# Patient Record
Sex: Female | Born: 1970
Health system: Southern US, Community
[De-identification: ages and names within clinical notes are randomized; demographics above are authoritative.]

## PROBLEM LIST (undated history)

## (undated) DIAGNOSIS — J45909 Unspecified asthma, uncomplicated: Secondary | ICD-10-CM

## (undated) DIAGNOSIS — T7840XA Allergy, unspecified, initial encounter: Secondary | ICD-10-CM

## (undated) DIAGNOSIS — E785 Hyperlipidemia, unspecified: Secondary | ICD-10-CM

## (undated) DIAGNOSIS — IMO0001 Reserved for inherently not codable concepts without codable children: Secondary | ICD-10-CM

## (undated) DIAGNOSIS — E119 Type 2 diabetes mellitus without complications: Secondary | ICD-10-CM

## (undated) HISTORY — DX: Reserved for inherently not codable concepts without codable children: IMO0001

## (undated) HISTORY — PX: FOOT SURGERY: SHX648

## (undated) HISTORY — PX: KNEE SURGERY: SHX244

## (undated) HISTORY — PX: TUBAL LIGATION: SHX77

## (undated) HISTORY — DX: Unspecified asthma, uncomplicated: J45.909

## (undated) HISTORY — DX: Allergy, unspecified, initial encounter: T78.40XA

## (undated) HISTORY — DX: Hyperlipidemia, unspecified: E78.5

## (undated) HISTORY — PX: ABDOMINAL HYSTERECTOMY: SHX81

---

## 2012-11-27 DIAGNOSIS — IMO0002 Reserved for concepts with insufficient information to code with codable children: Secondary | ICD-10-CM

## 2012-11-27 DIAGNOSIS — M1710 Unilateral primary osteoarthritis, unspecified knee: Secondary | ICD-10-CM | POA: Insufficient documentation

## 2012-11-27 HISTORY — DX: Reserved for concepts with insufficient information to code with codable children: IMO0002

## 2014-03-27 DIAGNOSIS — J4520 Mild intermittent asthma, uncomplicated: Secondary | ICD-10-CM | POA: Insufficient documentation

## 2014-03-27 DIAGNOSIS — E785 Hyperlipidemia, unspecified: Secondary | ICD-10-CM

## 2014-03-27 DIAGNOSIS — E559 Vitamin D deficiency, unspecified: Secondary | ICD-10-CM | POA: Insufficient documentation

## 2014-03-27 HISTORY — DX: Hyperlipidemia, unspecified: E78.5

## 2014-09-15 DIAGNOSIS — M999 Biomechanical lesion, unspecified: Secondary | ICD-10-CM | POA: Insufficient documentation

## 2014-09-15 DIAGNOSIS — M9901 Segmental and somatic dysfunction of cervical region: Secondary | ICD-10-CM | POA: Insufficient documentation

## 2014-09-15 DIAGNOSIS — IMO0001 Reserved for inherently not codable concepts without codable children: Secondary | ICD-10-CM | POA: Insufficient documentation

## 2015-10-31 DIAGNOSIS — M2390 Unspecified internal derangement of unspecified knee: Secondary | ICD-10-CM | POA: Insufficient documentation

## 2017-03-30 DIAGNOSIS — K5909 Other constipation: Secondary | ICD-10-CM | POA: Insufficient documentation

## 2017-03-30 DIAGNOSIS — K219 Gastro-esophageal reflux disease without esophagitis: Secondary | ICD-10-CM | POA: Insufficient documentation

## 2017-03-30 DIAGNOSIS — J301 Allergic rhinitis due to pollen: Secondary | ICD-10-CM | POA: Insufficient documentation

## 2017-05-17 DIAGNOSIS — R7301 Impaired fasting glucose: Secondary | ICD-10-CM | POA: Insufficient documentation

## 2017-05-17 HISTORY — DX: Impaired fasting glucose: R73.01

## 2017-10-04 DIAGNOSIS — G4733 Obstructive sleep apnea (adult) (pediatric): Secondary | ICD-10-CM | POA: Insufficient documentation

## 2017-10-04 DIAGNOSIS — G4739 Other sleep apnea: Secondary | ICD-10-CM | POA: Insufficient documentation

## 2019-07-03 DIAGNOSIS — B0229 Other postherpetic nervous system involvement: Secondary | ICD-10-CM | POA: Insufficient documentation

## 2020-01-22 DIAGNOSIS — R5382 Chronic fatigue, unspecified: Secondary | ICD-10-CM | POA: Diagnosis not present

## 2020-01-22 DIAGNOSIS — R7301 Impaired fasting glucose: Secondary | ICD-10-CM | POA: Diagnosis not present

## 2020-01-22 DIAGNOSIS — R635 Abnormal weight gain: Secondary | ICD-10-CM | POA: Diagnosis not present

## 2020-01-22 DIAGNOSIS — N958 Other specified menopausal and perimenopausal disorders: Secondary | ICD-10-CM | POA: Diagnosis not present

## 2020-01-22 DIAGNOSIS — E559 Vitamin D deficiency, unspecified: Secondary | ICD-10-CM | POA: Diagnosis not present

## 2020-02-04 DIAGNOSIS — Z1331 Encounter for screening for depression: Secondary | ICD-10-CM | POA: Diagnosis not present

## 2020-02-04 DIAGNOSIS — F5101 Primary insomnia: Secondary | ICD-10-CM | POA: Diagnosis not present

## 2020-02-04 DIAGNOSIS — R5382 Chronic fatigue, unspecified: Secondary | ICD-10-CM | POA: Diagnosis not present

## 2020-02-04 DIAGNOSIS — Z1339 Encounter for screening examination for other mental health and behavioral disorders: Secondary | ICD-10-CM | POA: Diagnosis not present

## 2020-02-04 DIAGNOSIS — N958 Other specified menopausal and perimenopausal disorders: Secondary | ICD-10-CM | POA: Diagnosis not present

## 2020-02-04 DIAGNOSIS — R232 Flushing: Secondary | ICD-10-CM | POA: Diagnosis not present

## 2020-02-19 DIAGNOSIS — Z6834 Body mass index (BMI) 34.0-34.9, adult: Secondary | ICD-10-CM | POA: Diagnosis not present

## 2020-02-19 DIAGNOSIS — E7800 Pure hypercholesterolemia, unspecified: Secondary | ICD-10-CM | POA: Diagnosis not present

## 2020-04-21 ENCOUNTER — Ambulatory Visit: Payer: Self-pay | Admitting: Family Medicine

## 2020-06-15 ENCOUNTER — Ambulatory Visit (INDEPENDENT_AMBULATORY_CARE_PROVIDER_SITE_OTHER): Payer: BC Managed Care – PPO

## 2020-06-15 ENCOUNTER — Ambulatory Visit: Payer: BC Managed Care – PPO | Admitting: Podiatry

## 2020-06-15 ENCOUNTER — Other Ambulatory Visit: Payer: Self-pay

## 2020-06-15 ENCOUNTER — Encounter: Payer: Self-pay | Admitting: Podiatry

## 2020-06-15 DIAGNOSIS — M2012 Hallux valgus (acquired), left foot: Secondary | ICD-10-CM

## 2020-06-15 DIAGNOSIS — M21611 Bunion of right foot: Secondary | ICD-10-CM

## 2020-06-15 DIAGNOSIS — M2042 Other hammer toe(s) (acquired), left foot: Secondary | ICD-10-CM | POA: Diagnosis not present

## 2020-06-15 DIAGNOSIS — M79675 Pain in left toe(s): Secondary | ICD-10-CM

## 2020-06-15 DIAGNOSIS — M2041 Other hammer toe(s) (acquired), right foot: Secondary | ICD-10-CM | POA: Diagnosis not present

## 2020-06-15 DIAGNOSIS — M79674 Pain in right toe(s): Secondary | ICD-10-CM

## 2020-06-15 DIAGNOSIS — M21612 Bunion of left foot: Secondary | ICD-10-CM

## 2020-06-15 DIAGNOSIS — M2011 Hallux valgus (acquired), right foot: Secondary | ICD-10-CM | POA: Diagnosis not present

## 2020-06-15 NOTE — Patient Instructions (Signed)
More silicone pads can be purchased from:  [email or web address]      Avoid shoes that crowd or compress the toes. Try the gel pads to see if they help. When you buy shoes next, have your feet measured for length and width.

## 2020-06-15 NOTE — Progress Notes (Signed)
  Subjective:  Patient ID: Cassidy Leach, female    DOB: 1971-01-04,  MRN: [id number]  Chief Complaint  Patient presents with  . Toe Pain    Pain in my toes when i wear closed toe shoes     49 y.o. female presents with the above complaint. History confirmed with patient.   Objective:  Physical Exam: warm, good capillary refill, no trophic changes or ulcerative lesions, normal DP and PT pulses and normal sensory exam.  Bilaterally she has mild hallux valgus with a dorsal medial eminence, adductovarus contracture toes of 4 and 5 and very mild semireducible hammertoes of 2 and 3, these are longer than the other toes  Radiographs: X-ray of both feet: Mild hallux abductovalgus with hammertoe contractures 2 through 5 with adductovarus rotation of 4 and 5 bilaterally Assessment:   1. Pain in toes of both feet   2. Hallux valgus with bunions, left   3. Hallux valgus with bunions, right   4. Hammertoe of right foot   5. Hammertoe of left foot      Plan:  Patient was evaluated and treated and all questions answered.  Discussed with her that her pain is likely secondary to mild hammertoe and bunion formation and that there is more pressure on the second and third toes.  This is exacerbated by the hallux valgus deformity as well as the adductovarus rotation of 4 and 5 and these are taking most of the pressure.  We discussed surgical and nonsurgical treatment options for hallux valgus and hammertoes.  I recommended today which she try shoe gear modifications which we discussed such as wider shoe gear and remove her toe box as well as make sure that they fit correctly from a lacing pattern standpoint and the new silicone pads a number of which were dispensed and she was given resources on how to get more these.  If they continue be bothersome or she is interested in surgical correction of the deformities and then she will return and we will discuss further  Return if symptoms worsen or fail to  improve.

## 2020-06-22 ENCOUNTER — Encounter: Payer: Self-pay | Admitting: Podiatry

## 2020-06-29 ENCOUNTER — Encounter (INDEPENDENT_AMBULATORY_CARE_PROVIDER_SITE_OTHER): Payer: Self-pay

## 2020-07-03 ENCOUNTER — Ambulatory Visit (INDEPENDENT_AMBULATORY_CARE_PROVIDER_SITE_OTHER): Payer: BC Managed Care – PPO

## 2020-07-03 ENCOUNTER — Other Ambulatory Visit: Payer: Self-pay

## 2020-07-03 ENCOUNTER — Ambulatory Visit: Payer: BC Managed Care – PPO | Admitting: Podiatry

## 2020-07-03 DIAGNOSIS — M2011 Hallux valgus (acquired), right foot: Secondary | ICD-10-CM | POA: Diagnosis not present

## 2020-07-03 DIAGNOSIS — M79675 Pain in left toe(s): Secondary | ICD-10-CM | POA: Diagnosis not present

## 2020-07-03 DIAGNOSIS — M79674 Pain in right toe(s): Secondary | ICD-10-CM

## 2020-07-03 DIAGNOSIS — M2012 Hallux valgus (acquired), left foot: Secondary | ICD-10-CM | POA: Diagnosis not present

## 2020-07-03 DIAGNOSIS — M21611 Bunion of right foot: Secondary | ICD-10-CM

## 2020-07-03 DIAGNOSIS — M21612 Bunion of left foot: Secondary | ICD-10-CM

## 2020-07-06 NOTE — Progress Notes (Signed)
  Subjective:  Patient ID: Cassidy Leach, female    DOB: Sep 06, 1970,  MRN: [id number]  Chief Complaint  Patient presents with  . Follow-up    Pt states theres no improvement- further evalaution      49 y.o. female returns with the above complaint. History confirmed with patient.  Here for surgical consult for painful bunion  Objective:  Physical Exam: warm, good capillary refill, no trophic changes or ulcerative lesions, normal DP and PT pulses and normal sensory exam.  Bilaterally she has mild hallux valgus with a dorsal medial eminence, adductovarus contracture toes of 4 and 5 and very mild semireducible hammertoes of 2 and 3, these are longer than the other toes  Radiographs: X-ray of both feet: Mild hallux abductovalgus with hammertoe contractures 2 through 5 with adductovarus rotation of 4 and 5 bilaterally Assessment:   1. Pain in toes of both feet   2. Hallux valgus with bunions, left   3. Hallux valgus with bunions, right      Plan:  Patient was evaluated and treated and all questions answered.  Discussed the etiology and treatment including surgical and non surgical treatment for painful bunions.  She has exhausted all non surgical treatment prior to this visit including shoe gear changes and padding.  She desires surgical intervention. We discussed all risks including but not limited to: pain, swelling, infection, scar, numbness which may be temporary or permanent, chronic pain, stiffness, nerve pain or damage, wound healing problems, bone healing problems including delayed or non-union and recurrence. Specifically we discussed the following procedures: Percutaneous chevron osteotomy and Akin osteotomy bunion correction. Informed consent was signed today. Surgery will be scheduled at a mutually agreeable date. Information regarding this will be forwarded to our surgery scheduler.    Surgical plan:  Procedure: -Percutaneous chevron and Akin of the right  foot  Location: -[facility]  Anesthesia plan: -IV sedation local anesthesia  Postoperative pain plan: - Tylenol 1000 mg every 6 hours, ibuprofen 600 mg every 6 hours, gabapentin 300 mg every 8 hours x5 days, oxycodone 5 mg 1-2 tabs every 6 hours only as needed  DVT prophylaxis: -None required she will be WBAT  WB Restrictions / DME needs: -WBAT in surgical shoe was dispensed today  No follow-ups on file.

## 2020-07-27 ENCOUNTER — Telehealth: Payer: Self-pay

## 2020-07-27 NOTE — Telephone Encounter (Signed)
DOS 07/31/2020  DOUBLE OSTECTOMY RT - 28299 HAMMERTOE REPAIR 2,3 RT - 28285  [employer] EFFECTIVE DATE - 07/25/2020  PLAN DEDUCTIBLE - $0.00 OUT OF POCKET - $4250.00 W/ $4250.00 REMAINING COPAY $0.00 COINSURANCE - $0.00  SPOKE TO OSCAR AT [employer] ON 07/21/2020. HE STATED NO PRECERT IS REQUIRED FOR CPT [id number] OR 03403. CALL REF # [id number]

## 2020-07-29 ENCOUNTER — Encounter: Payer: Self-pay | Admitting: Podiatry

## 2020-07-31 ENCOUNTER — Other Ambulatory Visit: Payer: Self-pay | Admitting: Podiatry

## 2020-07-31 DIAGNOSIS — M2011 Hallux valgus (acquired), right foot: Secondary | ICD-10-CM

## 2020-07-31 DIAGNOSIS — M2041 Other hammer toe(s) (acquired), right foot: Secondary | ICD-10-CM

## 2020-07-31 DIAGNOSIS — M25571 Pain in right ankle and joints of right foot: Secondary | ICD-10-CM | POA: Diagnosis not present

## 2020-07-31 MED ORDER — OXYCODONE HCL 5 MG PO TABS: 5.0000 mg | ORAL_TABLET | ORAL | 0 refills | Status: AC | PRN

## 2020-07-31 MED ORDER — ACETAMINOPHEN 500 MG PO TABS: 1000.0000 mg | ORAL_TABLET | Freq: Four times a day (QID) | ORAL | 0 refills | Status: AC | PRN

## 2020-07-31 MED ORDER — GABAPENTIN 300 MG PO CAPS: 300.0000 mg | ORAL_CAPSULE | Freq: Three times a day (TID) | ORAL | 0 refills | Status: DC

## 2020-07-31 NOTE — Progress Notes (Signed)
07/31/20 [facility] R foot MICA bunionectomy hammertoes 2,3

## 2020-07-31 NOTE — Addendum Note (Signed)
Addended byLilian Kapur, [place] R on: 07/31/2020 11:44 AM   Modules accepted: Orders

## 2020-08-04 ENCOUNTER — Other Ambulatory Visit: Payer: Self-pay | Admitting: Podiatry

## 2020-08-04 NOTE — Telephone Encounter (Signed)
Please advise 

## 2020-08-06 ENCOUNTER — Ambulatory Visit (INDEPENDENT_AMBULATORY_CARE_PROVIDER_SITE_OTHER): Payer: BC Managed Care – PPO | Admitting: Podiatry

## 2020-08-06 ENCOUNTER — Other Ambulatory Visit: Payer: Self-pay

## 2020-08-06 ENCOUNTER — Ambulatory Visit (INDEPENDENT_AMBULATORY_CARE_PROVIDER_SITE_OTHER): Payer: BC Managed Care – PPO

## 2020-08-06 DIAGNOSIS — M2011 Hallux valgus (acquired), right foot: Secondary | ICD-10-CM

## 2020-08-06 DIAGNOSIS — M2012 Hallux valgus (acquired), left foot: Secondary | ICD-10-CM

## 2020-08-06 DIAGNOSIS — Z9889 Other specified postprocedural states: Secondary | ICD-10-CM

## 2020-08-06 DIAGNOSIS — M21611 Bunion of right foot: Secondary | ICD-10-CM

## 2020-08-06 DIAGNOSIS — M21612 Bunion of left foot: Secondary | ICD-10-CM

## 2020-08-06 DIAGNOSIS — M2041 Other hammer toe(s) (acquired), right foot: Secondary | ICD-10-CM

## 2020-08-07 ENCOUNTER — Telehealth: Payer: Self-pay | Admitting: *Deleted

## 2020-08-07 DIAGNOSIS — R2689 Other abnormalities of gait and mobility: Secondary | ICD-10-CM

## 2020-08-07 NOTE — Telephone Encounter (Signed)
Called and left VM for patient that her walker prescription may be picked up at office per Dr. Lilian Kapur.

## 2020-08-07 NOTE — Telephone Encounter (Signed)
Patient is requesting a prescription for a knee walker,has found a place to purchase.

## 2020-08-09 NOTE — Progress Notes (Signed)
  Subjective:  Patient ID: Cassidy Leach, female    DOB: 1971-01-18,  MRN: [id number]  Chief Complaint  Patient presents with  . Routine Post Op    Pt stated that she is doing well she has no concerns at this time and stated that her pain is very minimal.    DOS: 07/31/2020 Procedure: Right foot MICA bunionectomy and hammertoe correction 2 3  50 y.o. female returns for post-op check.  Doing well she has minimal pain  Review of Systems: Negative except as noted in the HPI. Denies N/V/F/Ch.   Objective:  There were no vitals filed for this visit. There is no height or weight on file to calculate BMI. Constitutional Well developed. Well nourished.  Vascular Foot warm and well perfused. Capillary refill normal to all digits.   Neurologic Normal speech. Oriented to person, place, and time. Epicritic sensation to light touch grossly present bilaterally.  Dermatologic Skin healing well without signs of infection. Skin edges well coapted without signs of infection.  Orthopedic: Tenderness to palpation noted about the surgical site.  Moderate edema.   Radiographs: Status post MICA bunionectomy and arthrodesis of the second and third toes Assessment:   1. Hallux valgus with bunions, right   2. Hammertoe of right foot   3. Post-operative state    Plan:  Patient was evaluated and treated and all questions answered.  S/p foot surgery right -Progressing as expected post-operatively. -XR: Consistent postoperative changes -WB Status: WBAT in surgical shoe -Sutures: We will remain intact until next visit. -Medications: No refills required -Foot redressed.  Return in about 1 week (around 08/13/2020).

## 2020-08-12 ENCOUNTER — Encounter: Payer: Self-pay | Admitting: Podiatry

## 2020-08-12 ENCOUNTER — Telehealth: Payer: Self-pay | Admitting: *Deleted

## 2020-08-13 ENCOUNTER — Other Ambulatory Visit: Payer: Self-pay

## 2020-08-13 ENCOUNTER — Ambulatory Visit (INDEPENDENT_AMBULATORY_CARE_PROVIDER_SITE_OTHER): Payer: BC Managed Care – PPO | Admitting: Podiatry

## 2020-08-13 DIAGNOSIS — M2041 Other hammer toe(s) (acquired), right foot: Secondary | ICD-10-CM

## 2020-08-13 DIAGNOSIS — Z9889 Other specified postprocedural states: Secondary | ICD-10-CM

## 2020-08-13 DIAGNOSIS — M2011 Hallux valgus (acquired), right foot: Secondary | ICD-10-CM

## 2020-08-13 DIAGNOSIS — M21611 Bunion of right foot: Secondary | ICD-10-CM

## 2020-08-13 NOTE — Telephone Encounter (Signed)
error 

## 2020-08-14 ENCOUNTER — Ambulatory Visit (INDEPENDENT_AMBULATORY_CARE_PROVIDER_SITE_OTHER): Payer: BC Managed Care – PPO | Admitting: Podiatry

## 2020-08-14 ENCOUNTER — Other Ambulatory Visit: Payer: Self-pay | Admitting: Podiatry

## 2020-08-14 ENCOUNTER — Encounter: Payer: Self-pay | Admitting: Podiatry

## 2020-08-14 ENCOUNTER — Telehealth: Payer: Self-pay | Admitting: Podiatry

## 2020-08-14 DIAGNOSIS — M2011 Hallux valgus (acquired), right foot: Secondary | ICD-10-CM

## 2020-08-14 DIAGNOSIS — M2041 Other hammer toe(s) (acquired), right foot: Secondary | ICD-10-CM

## 2020-08-14 DIAGNOSIS — Z9889 Other specified postprocedural states: Secondary | ICD-10-CM

## 2020-08-14 DIAGNOSIS — M21611 Bunion of right foot: Secondary | ICD-10-CM

## 2020-08-14 NOTE — Telephone Encounter (Signed)
Pt called stating she is bleeding through her sutures. I scheduled her an appt today with Dr. Ardelle Anton.

## 2020-08-15 ENCOUNTER — Encounter: Payer: Self-pay | Admitting: Podiatry

## 2020-08-15 MED ORDER — CEPHALEXIN 500 MG PO CAPS: 500.0000 mg | ORAL_CAPSULE | Freq: Three times a day (TID) | ORAL | 0 refills | Status: AC

## 2020-08-16 ENCOUNTER — Encounter: Payer: Self-pay | Admitting: Podiatry

## 2020-08-16 NOTE — Progress Notes (Signed)
  Subjective:  Patient ID: Cassidy Leach, female    DOB: January 29, 1971,  MRN: [id number]  Chief Complaint  Patient presents with  . Routine Post Op     POV #2 DOS 07/31/2020 BUNIONECTOMY OF RT FOOT, HAMMERTOE CORRECTION OF 2ND & 3RD TOES RT FOOT    DOS: 07/31/2020 Procedure: Right foot MICA bunionectomy and hammertoe correction 2 3  50 y.o. female returns for post-op check.  States the swelling has decreased.  She has slight drainage in the bandage.  She is only taking pain medication once this week  Review of Systems: Negative except as noted in the HPI. Denies N/V/F/Ch.   Objective:  There were no vitals filed for this visit. There is no height or weight on file to calculate BMI. Constitutional Well developed. Well nourished.  Vascular Foot warm and well perfused. Capillary refill normal to all digits.   Neurologic Normal speech. Oriented to person, place, and time. Epicritic sensation to light touch grossly present bilaterally.  Dermatologic Skin healing well without signs of infection. Skin edges well coapted without signs of infection.  Some maceration and drainage in the proximal screw incision  Orthopedic: Tenderness to palpation noted about the surgical site.  Moderate edema.   Radiographs: Status post MICA bunionectomy and arthrodesis of the second and third toes Assessment:   No diagnosis found. Plan:  Patient was evaluated and treated and all questions answered.  S/p foot surgery right -Progressing as expected post-operatively. -XR: Consistent postoperative changes -WB Status: WBAT in surgical shoe -Sutures: Removed today -Silicone bunion spacer and compression sleeve applied  Return in about 3 weeks (around 09/03/2020) for post op bunionectomy, new x-rays and pull pins from toes.

## 2020-08-19 NOTE — Progress Notes (Signed)
Subjective: Cassidy Leach is a 50 y.o. is seen today in office s/p right foot MICA bunion and hammertoe correction preformed on 07/31/2020 with Dr. Lilian Kapur. She presents today due to bleeding on the bandage. She states the sutures removed yesterday and since then she has had bleeding. No recent injury or falls.Denies any systemic complaints such as fevers, chills, nausea, vomiting. No calf pain, chest pain, shortness of breath.   Objective: General: No acute distress, AAOx3  DP/PT pulses palpable 2/4, CRT < 3 sec to all digits.  Protective sensation intact. Motor function intact.  RIGHT foot: Upon the over the bandage there is no active drainage or bleeding identified. Along one of the areas of the incisions with some macerated tissue. There is faint erythema identified however she states this area is present previously. No significant warmth of the foot. No drainage or pus identified at this time there is no ascending cellulitis. No fluctuation crepitation. No malodor. No other open lesions or pre-ulcerative lesions.  No pain with calf compression, swelling, warmth, erythema.   Assessment and Plan:  Status post right foot surgery, presents for bleeding  -Treatment options discussed including all alternatives, risks, and complications -There was some blood on the bandage but upon removal there is no active bleeding identified and there is no drainage. There is a macerated tissue recommended Betadine to the area followed by a dry sterile dressing which I applied today.  -Continue with immobilization, elevation. -There is 1 small area of erythema but I think this is more from inflammation as opposed to infection. She also states this has been present previously without any change. Will monitor. -Ice/elevation -Pain medication as needed. -Monitor for any clinical signs or symptoms of infection and DVT/PE and directed to call the office immediately should any occur or go to the ER. -Follow-up in  about 10 days with Dr. Lilian Kapur or sooner if any problems arise. In the meantime, encouraged to call the office with any questions, concerns, change in symptoms.   Ovid Curd, DPM

## 2020-08-24 ENCOUNTER — Encounter: Payer: Self-pay | Admitting: Podiatry

## 2020-08-24 ENCOUNTER — Other Ambulatory Visit: Payer: Self-pay

## 2020-08-24 ENCOUNTER — Ambulatory Visit (INDEPENDENT_AMBULATORY_CARE_PROVIDER_SITE_OTHER): Payer: BC Managed Care – PPO | Admitting: Podiatry

## 2020-08-24 DIAGNOSIS — M2041 Other hammer toe(s) (acquired), right foot: Secondary | ICD-10-CM

## 2020-08-24 DIAGNOSIS — Z9889 Other specified postprocedural states: Secondary | ICD-10-CM

## 2020-08-24 DIAGNOSIS — M2011 Hallux valgus (acquired), right foot: Secondary | ICD-10-CM

## 2020-08-24 DIAGNOSIS — M21611 Bunion of right foot: Secondary | ICD-10-CM

## 2020-08-24 MED ORDER — MUPIROCIN 2 % EX OINT: 1.0000 "application " | TOPICAL_OINTMENT | Freq: Every day | CUTANEOUS | 0 refills | Status: DC

## 2020-08-24 NOTE — Progress Notes (Signed)
  Subjective:  Patient ID: Cassidy Leach, female    DOB: 12/18/1970,  MRN: [id number]  Chief Complaint  Patient presents with  . Routine Post Op    PT stated that she is doing well she has no major concerns at this time. She stated that the swelling has gone down a lot and she has minimal pain    DOS: 07/31/2020 Procedure: Right foot MICA bunionectomy and hammertoe correction 2 3  50 y.o. female returns for post-op check.  She saw Dr. Ardelle Anton on 1/21 for an evaluation from drainage.  Has stopped bleeding since then.  She has not been put anything on it since a stop bleeding.  Review of Systems: Negative except as noted in the HPI. Denies N/V/F/Ch.   Objective:  There were no vitals filed for this visit. There is no height or weight on file to calculate BMI. Constitutional Well developed. Well nourished.  Vascular Foot warm and well perfused. Capillary refill normal to all digits.   Neurologic Normal speech. Oriented to person, place, and time. Epicritic sensation to light touch grossly present bilaterally.  Dermatologic  proximal screw incision is healing with mild delayed healing, remainder of incisions are well-healed.  Orthopedic: Tenderness to palpation noted about the surgical site.  Moderate edema.   Radiographs: Status post MICA bunionectomy and arthrodesis of the second and third toes Assessment:   No diagnosis found. Plan:  Patient was evaluated and treated and all questions answered.  S/p foot surgery right -Progressing as expected post-operatively. -XR: Consistent postoperative changes -WB Status: WBAT in surgical shoe -Rx for mupirocin to apply daily with a bandage on the proximal screw incision -Continue compression with Ace wrap -At next visit we will remove K wires, and will be able to resume regular shoe gear after new x-rays  Return in 10 days (on 09/03/2020) for new x-rays on right foot, pull pins from toes.

## 2020-08-27 ENCOUNTER — Encounter: Payer: BC Managed Care – PPO | Admitting: Podiatry

## 2020-09-03 ENCOUNTER — Encounter: Payer: Self-pay | Admitting: Podiatry

## 2020-09-03 ENCOUNTER — Ambulatory Visit (INDEPENDENT_AMBULATORY_CARE_PROVIDER_SITE_OTHER): Payer: BC Managed Care – PPO

## 2020-09-03 ENCOUNTER — Other Ambulatory Visit: Payer: Self-pay

## 2020-09-03 ENCOUNTER — Ambulatory Visit (INDEPENDENT_AMBULATORY_CARE_PROVIDER_SITE_OTHER): Payer: BC Managed Care – PPO | Admitting: Podiatry

## 2020-09-03 DIAGNOSIS — M2011 Hallux valgus (acquired), right foot: Secondary | ICD-10-CM

## 2020-09-03 DIAGNOSIS — M21611 Bunion of right foot: Secondary | ICD-10-CM

## 2020-09-03 MED ORDER — LIDOCAINE 5 % EX OINT: 1.0000 "application " | TOPICAL_OINTMENT | CUTANEOUS | 1 refills | Status: DC | PRN

## 2020-09-03 NOTE — Progress Notes (Signed)
  Subjective:  Patient ID: Cassidy Leach, female    DOB: [birth year],  MRN: [id number]  Chief Complaint  Patient presents with  . Routine Post Op    Post Op PT stated that she is doing well and she stated that she has no concerns at this time    DOS: 07/31/2020 Procedure: Right foot MICA bunionectomy and hammertoe correction 2 3  50 y.o. female returns for post-op check.  Doing better.  Her pain and swelling continues to improve.  She does have pain at night along the plantar lateral portion of the hallux which has been very bothersome for her.  Review of Systems: Negative except as noted in the HPI. Denies N/V/F/Ch.   Objective:  There were no vitals filed for this visit. There is no height or weight on file to calculate BMI. Constitutional Well developed. Well nourished.  Vascular Foot warm and well perfused. Capillary refill normal to all digits.   Neurologic Normal speech. Oriented to person, place, and time. Epicritic sensation to light touch grossly present bilaterally.  Dermatologic  proximal screw incision is healing with mild delayed healing, remainder of incisions are well-healed.  Orthopedic: Tenderness to palpation noted about the surgical site.  Moderate edema.   Radiographs: Status post MICA bunionectomy and arthrodesis of the second and third toes, good healing of the arthrodesis sites of the second and third toes, the distal bunion screw has rotated and the chamfered edge is prominent now, the distal tip of the phalanx screw is also prominent Assessment:   1. Hallux valgus with bunions, right    Plan:  Patient was evaluated and treated and all questions answered.  S/p foot surgery right -Progressing as expected post-operatively. -XR: Consistent with postoperative changes, I reviewed these together and I think likely she will require hard removal of the screws as the chamfered edge is prominent proximally in the phalanx or I think likely is probably irritating  the digital nerve.  When she has achieved bony union we will remove them. -WB Status: WBAT in regular shoes.  Advised to never go barefoot and wear supportive shoes even around the house for now -Continue compression  -both K wires removed today without incident  Return in about 1 month (around 10/01/2020).

## 2020-09-10 ENCOUNTER — Encounter: Payer: Self-pay | Admitting: Podiatry

## 2020-09-11 MED ORDER — GABAPENTIN 300 MG PO CAPS: ORAL_CAPSULE | ORAL | 2 refills | Status: DC

## 2020-09-29 ENCOUNTER — Ambulatory Visit (INDEPENDENT_AMBULATORY_CARE_PROVIDER_SITE_OTHER): Payer: BC Managed Care – PPO | Admitting: Podiatry

## 2020-09-29 ENCOUNTER — Ambulatory Visit (INDEPENDENT_AMBULATORY_CARE_PROVIDER_SITE_OTHER): Payer: BC Managed Care – PPO

## 2020-09-29 ENCOUNTER — Other Ambulatory Visit: Payer: Self-pay

## 2020-09-29 DIAGNOSIS — M21611 Bunion of right foot: Secondary | ICD-10-CM

## 2020-09-29 DIAGNOSIS — M2011 Hallux valgus (acquired), right foot: Secondary | ICD-10-CM | POA: Diagnosis not present

## 2020-09-29 DIAGNOSIS — M2041 Other hammer toe(s) (acquired), right foot: Secondary | ICD-10-CM

## 2020-09-29 DIAGNOSIS — Z9889 Other specified postprocedural states: Secondary | ICD-10-CM

## 2020-09-29 NOTE — Progress Notes (Signed)
  Subjective:  Patient ID: Cassidy Leach, female    DOB: 1971/03/26,  MRN: [id number]  Chief Complaint  Patient presents with  . Routine Post Op    PT stated that she is doing better she does still have some pain but it comes and goes.    DOS: 07/31/2020 Procedure: Right foot MICA bunionectomy and hammertoe correction 2 3  50 y.o. female returns for post-op check.  She is doing much better than she was.  She has taken the gabapentin some and it has been very helpful for her  Review of Systems: Negative except as noted in the HPI. Denies N/V/F/Ch.   Objective:  There were no vitals filed for this visit. There is no height or weight on file to calculate BMI. Constitutional Well developed. Well nourished.  Vascular Foot warm and well perfused. Capillary refill normal to all digits.   Neurologic Normal speech. Oriented to person, place, and time. Epicritic sensation to light touch grossly present bilaterally.  Dermatologic  all incisions are well-healed and not hypertrophic at this point  Orthopedic:  She has mild tenderness over the medial portion of the hallux   Radiographs: Status post MICA bunionectomy and arthrodesis of the second and third toes, progressive interval healing of the arthrodesis sites of the second and third toes, the distal bunion screw has rotated and the chamfered edge is prominent the distal tip of the phalanx screw is also prominent Assessment:   1. Hallux valgus with bunions, right   2. Hammertoe of right foot   3. Post-operative state    Plan:  Patient was evaluated and treated and all questions answered.  S/p foot surgery right -She is doing much better and her pain is not controlled with the gabapentin when she needs it.  I discussed with her that I think we should still plan to take her hardware out.  I would like to give her at minimum 3 months but ideally about 6 months out from surgery before we do this.  I will see her back in about 10 weeks to  plan for surgery in late June or early July to remove the screws, the earliest I would take them out would be mid April she should return sooner if they become more symptomatic or the gabapentin is not helping. -Weightbearing as tolerated in regular shoe gear, she has no activity restrictions at this point   Return in about 10 weeks (around 12/08/2020) for re-check bunion, take x-rays, plan to remove screws in July.

## 2020-10-30 ENCOUNTER — Telehealth (HOSPITAL_BASED_OUTPATIENT_CLINIC_OR_DEPARTMENT_OTHER): Payer: Self-pay | Admitting: Nurse Practitioner

## 2020-10-30 NOTE — Telephone Encounter (Signed)
Left message to reschedule appt from 4/11 to another day - [facility]

## 2020-11-02 ENCOUNTER — Ambulatory Visit (HOSPITAL_BASED_OUTPATIENT_CLINIC_OR_DEPARTMENT_OTHER): Payer: Self-pay | Admitting: Nurse Practitioner

## 2020-11-03 ENCOUNTER — Telehealth: Payer: BC Managed Care – PPO | Admitting: Emergency Medicine

## 2020-11-03 DIAGNOSIS — J301 Allergic rhinitis due to pollen: Secondary | ICD-10-CM

## 2020-11-03 MED ORDER — AZELASTINE HCL 0.05 % OP SOLN: 1.0000 [drp] | Freq: Two times a day (BID) | OPHTHALMIC | 0 refills | Status: DC

## 2020-11-03 MED ORDER — SYSTANE 0.4-0.3 % OP SOLN: OPHTHALMIC | 0 refills | Status: DC

## 2020-11-03 MED ORDER — LEVOCETIRIZINE DIHYDROCHLORIDE 5 MG PO TABS: 5.0000 mg | ORAL_TABLET | Freq: Every evening | ORAL | 0 refills | Status: DC

## 2020-11-03 MED ORDER — AZELASTINE HCL 0.1 % NA SOLN: 2.0000 | Freq: Two times a day (BID) | NASAL | 0 refills | Status: AC

## 2020-11-03 NOTE — Progress Notes (Signed)
E visit for Allergic Rhinitis We are sorry that you are not feeling well.  Here is how we plan to help!  Based on what you have shared with me it looks like you have Allergic Rhinitis.  Rhinitis is when a reaction occurs that causes nasal congestion, runny nose, sneezing, and itching.  Most types of rhinitis are caused by an inflammation and are associated with symptoms in the eyes ears or throat. There are several types of rhinitis.  The most common are acute rhinitis, which is usually caused by a viral illness, allergic or seasonal rhinitis, and nonallergic or year-round rhinitis.  Nasal allergies occur certain times of the year.  Allergic rhinitis is caused when allergens in the air trigger the release of histamine in the body.  Histamine causes itching, swelling, and fluid to build up in the fragile linings of the nasal passages, sinuses and eyelids.  An itchy nose and clear discharge are common.  I have prescribed the following over the counter treatments, which can sometimes be cheaper when prescribed compared to over the counter (please discuss payment with pharmacist): Xyzal 5 mg take 1 tablet daily  I also would recommend a nasal spray: Asteline nasal spray, 2 sprays per nostril, twice daily  You may also benefit from eye drops such as: Systane 1-2 driops each eye twice daily as needed  HOME CARE:   You can use an over-the-counter saline nasal spray as needed  Avoid areas where there is heavy dust, mites, or molds  Stay indoors on windy days during the pollen season  Keep windows closed in home, at least in bedroom; use air conditioner.  Use high-efficiency house air filter  Keep windows closed in car, turn AC on re-circulate  Avoid playing out with dog during pollen season  GET HELP RIGHT AWAY IF:   If your symptoms do not improve within 10 days  You become short of breath  You develop yellow or green discharge from your nose for over 3 days  You have coughing  fits  MAKE SURE YOU:   Understand these instructions  Will watch your condition  Will get help right away if you are not doing well or get worse  Thank you for choosing an e-visit. Your e-visit answers were reviewed by a board certified advanced clinical practitioner to complete your personal care plan. Depending upon the condition, your plan could have included both over the counter or prescription medications. Please review your pharmacy choice. Be sure that the pharmacy you have chosen is open so that you can pick up your prescription now.  If there is a problem you may message your provider in MyChart to have the prescription routed to another pharmacy. Your safety is important to [place]. If you have drug allergies check your prescription carefully.  For the next 24 hours, you can use MyChart to ask questions about today's visit, request a non-urgent call back, or ask for a work or school excuse from your e-visit provider. You will get an email in the next two days asking about your experience. I hope that your e-visit has been valuable and will speed your recovery.      Approximately 5 minutes was spent documenting and reviewing patient's chart.

## 2020-11-03 NOTE — Addendum Note (Signed)
Addended by: Waldon Merl on: 11/03/2020 02:29 PM   Modules accepted: Orders

## 2020-11-06 ENCOUNTER — Encounter: Payer: Self-pay | Admitting: Physician Assistant

## 2020-11-06 ENCOUNTER — Telehealth: Payer: BC Managed Care – PPO | Admitting: Physician Assistant

## 2020-11-06 DIAGNOSIS — H1033 Unspecified acute conjunctivitis, bilateral: Secondary | ICD-10-CM | POA: Diagnosis not present

## 2020-11-06 MED ORDER — ERYTHROMYCIN 5 MG/GM OP OINT: 1.0000 "application " | TOPICAL_OINTMENT | Freq: Every day | OPHTHALMIC | 0 refills | Status: DC

## 2020-11-06 NOTE — Progress Notes (Signed)
[name], risk are scheduled for a virtual visit with your provider today.    Just as we do with appointments in the office, we must obtain your consent to participate.  Your consent will be active for this visit and any virtual visit you may have with one of our providers in the next 365 days.    If you have a MyChart account, I can also send a copy of this consent to you electronically.  All virtual visits are billed to your insurance company just like a traditional visit in the office.  As this is a virtual visit, video technology does not allow for your provider to perform a traditional examination.  This may limit your provider's ability to fully assess your condition.  If your provider identifies any concerns that need to be evaluated in person or the need to arrange testing such as labs, EKG, etc, we will make arrangements to do so.    Although advances in technology are sophisticated, we cannot ensure that it will always work on either your end or our end.  If the connection with a video visit is poor, we may have to switch to a telephone visit.  With either a video or telephone visit, we are not always able to ensure that we have a secure connection.   I need to obtain your verbal consent now.   Are you willing to proceed with your visit today?   Janus Vlcek has provided verbal consent on 11/06/2020 for a virtual visit (video or telephone).   Margaretann Loveless, PA-C 11/06/2020  8:07 AM     MyChart Video Visit    Virtual Visit via Video Note   This visit type was conducted due to national recommendations for restrictions regarding the COVID-19 Pandemic (e.g. social distancing) in an effort to limit this patient's exposure and mitigate transmission in our community. This patient is at least at moderate risk for complications without adequate follow up. This format is felt to be most appropriate for this patient at this time. Physical exam was limited by quality of the video and  audio technology used for the visit.   Patient location: Home Provider location: Home office in [place]  I discussed the limitations of evaluation and management by telemedicine and the availability of in person appointments. The patient expressed understanding and agreed to proceed.  Patient: Cassidy Leach   DOB: 1970-12-18   50 y.o. Female  MRN: [id number] Visit Date: 11/06/2020  Today's healthcare provider: Margaretann Loveless, PA-C   No chief complaint on file.  Subjective    Eye Problem  Both (R>L) eyes are affected.This is a recurrent problem. The current episode started in the past 7 days. The problem occurs constantly. The problem has been gradually worsening. There was no injury mechanism. The pain is at a severity of 2/10. The pain is mild. There is no known exposure to pink eye. She does not wear contacts. Associated symptoms include blurred vision, an eye discharge, eye redness, a foreign body sensation, itching and photophobia. Pertinent negatives include no double vision, fever, nausea, recent URI or vomiting. She has tried commercial eye wash and eye drops for the symptoms. The treatment provided no relief.    Patient does have a H/O corneal abrasion and states this is starting to feel similar in the right eye.  Patient Active Problem List   Diagnosis Date Noted  . Neuralgia, post-herpetic 07/03/2019  . Other sleep apnea 10/04/2017  . Impaired fasting glucose 05/17/2017  .  Chronic constipation 03/30/2017  . Gastroesophageal reflux disease without esophagitis 03/30/2017  . Seasonal allergic rhinitis due to pollen 03/30/2017  . Patellar malalignment syndrome 10/31/2015  . Myalgia and myositis 09/15/2014  . Nonallopathic lesion of sacral region 09/15/2014  . Segmental and somatic dysfunction of cervical region 09/15/2014  . Asthma, mild intermittent 03/27/2014  . Hyperlipidemia 03/27/2014  . Vitamin D deficiency 03/27/2014  . Osteoarthrosis involving lower leg  11/27/2012  . Osteoarthrosis, localized, primary, involving lower leg 11/27/2012   No past medical history on file.    Medications: Outpatient Medications Prior to Visit  Medication Sig  . azelastine (ASTELIN) 0.1 % nasal spray Place 2 sprays into both nostrils 2 (two) times daily. Use in each nostril as directed  . azelastine (OPTIVAR) 0.05 % ophthalmic solution Place 1 drop into both eyes 2 (two) times daily.  . fluticasone (FLONASE) 50 MCG/ACT nasal spray Place into both nostrils.  Marland Kitchen gabapentin (NEURONTIN) 300 MG capsule Take 1 capsule (300 mg total) by mouth at bedtime for 7 days, THEN 1 capsule (300 mg total) 2 (two) times daily for 7 days, THEN 1 capsule (300 mg total) 3 (three) times daily for 7 days.  Marland Kitchen levocetirizine (XYZAL) 5 MG tablet Take 1 tablet (5 mg total) by mouth every evening.  . lidocaine (XYLOCAINE) 5 % ointment Apply 1 application topically as needed.  . methylPREDNISolone (MEDROL DOSEPAK) 4 MG TBPK tablet Take by mouth.  . mupirocin ointment (BACTROBAN) 2 % Apply 1 application topically daily.  Marland Kitchen omeprazole (PRILOSEC) 40 MG capsule Take 40 mg by mouth daily.  Bertram Gala Glycol-Propyl Glycol (SYSTANE) 0.4-0.3 % SOLN 1-2 drops per eye twice daily as needed   No facility-administered medications prior to visit.    Review of Systems  Constitutional: Negative for fever.  Eyes: Positive for blurred vision, photophobia, discharge, redness and itching. Negative for double vision.  Gastrointestinal: Negative for nausea and vomiting.    Last CBC No results found for: WBC, HGB, HCT, MCV, MCH, RDW, PLT Last metabolic panel No results found for: GLUCOSE, NA, K, CL, CO2, BUN, CREATININE, GFRNONAA, GFRAA, CALCIUM, PHOS, PROT, ALBUMIN, LABGLOB, AGRATIO, BILITOT, ALKPHOS, AST, ALT, ANIONGAP    Objective    There were no vitals taken for this visit. BP Readings from Last 3 Encounters:  No data found for BP   Wt Readings from Last 3 Encounters:  No data found for Wt       Physical Exam Vitals reviewed.  Constitutional:      General: She is not in acute distress.    Appearance: Normal appearance. She is well-developed. She is not ill-appearing.  HENT:     Head: Normocephalic and atraumatic.  Eyes:     General: No scleral icterus.       Right eye: Discharge present.     Conjunctiva/sclera:     Right eye: Right conjunctiva is injected.  Pulmonary:     Effort: Pulmonary effort is normal. No respiratory distress.  Musculoskeletal:     Cervical back: Normal range of motion and neck supple.  Neurological:     Mental Status: She is alert.  Psychiatric:        Mood and Affect: Mood normal.        Behavior: Behavior normal.        Thought Content: Thought content normal.        Judgment: Judgment normal.        Assessment & Plan     1. Acute bacterial conjunctivitis of both  eyes Patient has been trying OTC eye drops and recently had other eye drops prescribed that are not helping. Symptoms continue to progress, having excessive watering, now with pain in the right eye with blinking. Will add erythromycin ointment as below for possible transition to bacterial conjunctivitis vs recurrent corneal abrasion. Advised to follow up with an Ophthalmologist if not improving or worsening.  - erythromycin ophthalmic ointment; Place 1 application into both eyes at bedtime.  Dispense: 3.5 g; Refill: 0   No follow-ups on file.     I discussed the assessment and treatment plan with the patient. The patient was provided an opportunity to ask questions and all were answered. The patient agreed with the plan and demonstrated an understanding of the instructions.   The patient was advised to call back or seek an in-person evaluation if the symptoms worsen or if the condition fails to improve as anticipated.  I provided 6 minutes of face-to-face time during this encounter via MyChart Video enabled encounter.  Reine Just [facility]  Telehealth [phone] (phone) [phone] (fax)  [facility]

## 2020-11-06 NOTE — Patient Instructions (Signed)

## 2020-11-10 ENCOUNTER — Ambulatory Visit (HOSPITAL_BASED_OUTPATIENT_CLINIC_OR_DEPARTMENT_OTHER): Payer: BC Managed Care – PPO | Admitting: Nurse Practitioner

## 2020-11-10 ENCOUNTER — Encounter (HOSPITAL_BASED_OUTPATIENT_CLINIC_OR_DEPARTMENT_OTHER): Payer: Self-pay | Admitting: Nurse Practitioner

## 2020-11-10 ENCOUNTER — Other Ambulatory Visit: Payer: Self-pay

## 2020-11-10 VITALS — BP 131/89 | HR 81 | Ht 67.0 in | Wt 230.8 lb

## 2020-11-10 DIAGNOSIS — E785 Hyperlipidemia, unspecified: Secondary | ICD-10-CM

## 2020-11-10 DIAGNOSIS — Z7689 Persons encountering health services in other specified circumstances: Secondary | ICD-10-CM | POA: Diagnosis not present

## 2020-11-10 DIAGNOSIS — J4520 Mild intermittent asthma, uncomplicated: Secondary | ICD-10-CM | POA: Diagnosis not present

## 2020-11-10 DIAGNOSIS — K5909 Other constipation: Secondary | ICD-10-CM

## 2020-11-10 DIAGNOSIS — Z0000 Encounter for general adult medical examination without abnormal findings: Secondary | ICD-10-CM | POA: Insufficient documentation

## 2020-11-10 DIAGNOSIS — E559 Vitamin D deficiency, unspecified: Secondary | ICD-10-CM

## 2020-11-10 DIAGNOSIS — Z1231 Encounter for screening mammogram for malignant neoplasm of breast: Secondary | ICD-10-CM | POA: Insufficient documentation

## 2020-11-10 DIAGNOSIS — K219 Gastro-esophageal reflux disease without esophagitis: Secondary | ICD-10-CM

## 2020-11-10 DIAGNOSIS — R7301 Impaired fasting glucose: Secondary | ICD-10-CM

## 2020-11-10 DIAGNOSIS — Z6836 Body mass index (BMI) 36.0-36.9, adult: Secondary | ICD-10-CM | POA: Insufficient documentation

## 2020-11-10 DIAGNOSIS — J301 Allergic rhinitis due to pollen: Secondary | ICD-10-CM

## 2020-11-10 HISTORY — DX: Persons encountering health services in other specified circumstances: Z76.89

## 2020-11-10 NOTE — Assessment & Plan Note (Signed)
BMI 36.15 today.  Recommendations for regular physical activity and dietary choices provided to patient to improve overall health and reduce chance of serious disease.  Excess skin adds to her body weight after bariatric sugery, therefore, BMI may not be a good indicator of true body mass.  Will discuss and evaluate further at CPE.

## 2020-11-10 NOTE — Assessment & Plan Note (Signed)
Chronic constipation with symptoms worse at times of increased stress. She controls this with as needed Miralax and increased fiber in her diet. Symptoms consistent with IBS-C. DIscussed that if her symptoms worsen or fail to respond to conservative treatment there are medications available that can be used to help with constipation specific concerns with IBS.  Reports that her most recent colonoscopy was in the last 10 years. Will obtain medical records for reconciliation and ensure that we stay up to date on her health maintenance.  Recommend follow-up if symptoms worsen or fail to improve.

## 2020-11-10 NOTE — Assessment & Plan Note (Signed)
History of GERD with daily prilosec for treatment, currently well controlled. No alarm symptoms present today. No abdominal tenderness noted.  Continue medications as prescribed and follow-up if symptoms worsen.

## 2020-11-10 NOTE — Assessment & Plan Note (Signed)
Well controlled at this time with as needed medication. She does not need refills today, but is aware that she can contact us when she needs these.  Recommend continued medications as directed and follow-up if symptoms worsen or fail to improve.

## 2020-11-10 NOTE — Patient Instructions (Addendum)
Recommendations from today's visit:  We will plan to schedule your annual physical exam with labs in June. Cassidy Leach or Cassidy Leach at the front desk will assist you with that when you leave today.   Based on the previous diagnoses we have, I have ordered the following lab work to be completed prior to your physical. Please be sure not to eat or drink anything but water or black coffee for at least 8 hours prior to the testing to ensure accurate results: CBC CMP Lipid Panel Hemoglobin A1c Vitamin D  If you would like additional testing, please let me know and I can add that on.   I will review your records and we can discuss any gaps that need to be completed at your physical.   If you need care before the physical or refills, please let us know.  ______________________________________________________________________  Thank you for choosing [place] [facility] for your Primary Care needs. I am excited for the opportunity to partner with you to meet your health care goals. It was a pleasure meeting you today!  I am an Adult-Geriatric Nurse Practitioner with a background in caring for patients for more than 20 years. I received my Paediatric nurse in Nursing and my Doctor of Nursing Practice degrees at [employer]. I received additional fellowship training in primary care and sports medicine after receiving my doctorate degree. I provide primary care and sports medicine services to patients age 92 and older within this office. I am also a provider with the [facility] and the director of the APP Fellowship with [facility]  I am a [place] native, but have called the [place] area home for nearly 20 years and am proud to be a member of this community.   I am passionate about providing the best service to you through preventive medicine and supportive care. I consider you a part of the medical team and value your input. I work  diligently to ensure that you are heard and your needs are met in a safe and effective manner. I want you to feel comfortable with me as your provider and want you to know that your health concerns are important to me.   For your information, our office hours are Monday- Friday 8:00 AM - 5:00 PM At this time I am not in the office on Wednesdays.  If you have questions or concerns, please call our office at [phone] or send [place] a MyChart message and we will respond as quickly as possible.   For all urgent or time sensitive needs we ask that you please call the office to avoid delays. MyChart is not constantly monitored and replies may take up to 72 business hours.  MyChart Policy: . MyChart allows for you to see your visit notes, after visit summary, provider recommendations, lab and tests results, make an appointment, request refills, and contact your provider or the office for non-urgent questions or concerns.  . Providers are seeing patients during normal business hours and do not have built in time to review MyChart messages. We ask that you allow a minimum of 72 business hours for MyChart message responses.  . Complex MyChart concerns may require a visit. Your provider may request you schedule a virtual or in person visit to ensure we are providing the best care possible. . MyChart messages sent after 4:00 PM on Friday will not be received by the provider until Monday morning.    Lab and  Test Results: . You will receive your lab and test results on MyChart as soon as they are completed and results have been sent by the lab or testing facility. Due to this service, you will receive your results BEFORE your provider.  . Please allow a minimum of 72 business hours for your provider to receive and review lab and test results and contact you about.   . Most lab and test result comments from the provider will be sent through [place]. Your provider may recommend changes to the plan of care,  follow-up visits, repeat testing, ask questions, or request an office visit to discuss these results. You may reply directly to this message or call the office at [phone] to provide information for the provider or set up an appointment. . In some instances, you will be called with test results and recommendations. Please let us know if this is preferred and we will make note of this in your chart to provide this for you.    . If you have not heard a response to your lab or test results in 72 business hours, please call the office to let us know.   After Hours: . For all non-emergency after hours needs, please call the office at [phone] and select the option to reach the on-call provider service. On-call services are shared between multiple [place] offices and therefore it will not be possible to speak directly with your provider. On-call providers may provide medical advice and recommendations, but are unable to provide refills for maintenance medications.  . For all emergency or urgent medical needs after normal business hours, we recommend that you seek care at the closest Urgent Care or Emergency Department to ensure appropriate treatment in a timely manner.  Nigel Bridgeman [place] [facility] has a 24 hour emergency room located on the ground floor for your convenience.    Please do not hesitate to reach out to [place] with concerns.   Thank you, again, for choosing me as your health care partner. I appreciate your trust and look forward to learning more about you.   Worthy Keeler, DNP, AGNP-c ___________________________________________________________________________________  Basic Lab Information:  CBC- Complete Blood Count-  This measures your red blood cells and white blood cells.  This gives me an idea if you are experiencing anemia or showing signs of infection. This is also used to monitor certain medications and chronic diseases.  If you levels are off, we may need to  add additional testing to help determine the cause and how to best manage this. We will let you know if we need to add additional labs.   CMP- Complete Metabolic Panel- This measures your kidney function, liver function, electrolytes, and your blood sugar.  This gives me an idea if you are having a problem with your electrolytes, kidneys, liver, or blood sugar. This is also used to help monitor certain medications and chronic diseases.  If your levels are off, we may need to add additional testing to help determine the cause and how to best manage this. We will let you know if we need to add additional labs or testing.    Lipids This measures the amount of cholesterol in your blood.  This gives me an idea if you are at risk of heart attack, stroke, or damage to your organs from too much cholesterol. This is also used to help monitor certain medications and chronic diseases.  If your levels are off, I may recommend dietary or medication changes  based on the levels to help reduce your risks.   Vitamin D This measures the amount of Vitamin D in your body.  Vitamin D is essential for many functions of the body and many Americans do not have enough in their blood.  If your levels are off, I may recommend a vitamin supplement to help increase the numbers.  Hepatitis C This tests for the hepatitis c antibody presence and is recommended as one time testing for all adults. Additional testing is recommended for patients who may be considered high risk to exposure or after known or suspected exposure.  Ajee Heasley diagnosis and treatment are essential to help prevent long term damage from hepatitis C, therefore testing prior to symptoms is always recommended.  If this test is positive, there are medication options available for the treatment of Hepatitis C. We will contact you to set up an appointment to discuss these options and recommendations.   HIV This tests for the presence of the HIV virus, which can  cause AIDS and is recommended as one time testing for all adults. Additional testing is recommended for patients who may be considered high risk to  This test is recommended one time for all adults with additional testing for patients who may be considered high risk for HIV or for those who may have had HIV exposure.  Gabrian Hoque diagnosis and treatment are essential to prevent long term complications, disease progression, spread of HIV, and death, therefore testing prior to symptoms is always recommended.  If this test is positive, there are many medication options available for the management of HIV which are highly effective at preventing spread of infection, quality of life, and reduction of symptoms.   ____________________________________________________________________________  EXERCISE RECOMMENDATIONS  I recommend that all adults get at least 20 minutes of moderate physical activity that elevates your heart rate at least 5 days out of the week.  Some examples include: . Walking or jogging at a pace that allows you to carry on a conversation . Cycling (stationary bike or outdoors) . Water aerobics . Yoga . Weight lifting . Dancing If physical limitations prevent you from putting stress on your joints, exercise in a pool or seated in a chair are excellent options.  Do determine your MAXIMUM heart rate for activity: YOUR AGE - 220 = MAX HeartRate   Remember! . Do not push yourself too hard.  . Start slowly and build up your pace, speed, weight, time in exercise, etc.  . Allow your body to rest between exercise and get good sleep. . You will need more water than normal when you are exerting yourself. Do not wait until you are thirsty to drink. Drink with a purpose of getting in at least 8, 8 ounce glasses of water a day plus more depending on how much you exercise and sweat.    If you begin to develop dizziness, chest pain, abdominal pain, jaw pain, shortness of breath, headache, vision  changes, lightheadedness, or other concerning symptoms, stop the activity and allow your body to rest. If your symptoms are severe, seek emergency evaluation immediately. If your symptoms are concerning, but not severe, please let us know so that we can recommend further evaluation.    DIET RECOMMENDATIONS  I recommend that all patients maintain a diet low in saturated fats, carbohydrates, and cholesterol. While this can be challenging at first, it is not impossible and small changes can make big differences.  Things to try: Marland Kitchen Decreasing the amount of soda, sweet tea,  and/or juice to one or less per day and replace with water o While water is always the first choice, if you do not like water you may consider - adding a water additive without sugar to improve the taste - other sugar free drinks . Replace potatoes with a brightly colored vegetable at dinner . Use healthy oils, such as canola oil or olive oil, instead of butter or hard margarine . Limit your bread intake to two pieces or less a day . Replace regular pasta with low carb pasta options . Bake, broil, or grill foods instead of frying . Monitor portion sizes  . Eat smaller, more frequent meals throughout the day instead of large meals  An important thing to remember is, if you love foods that are not great for your health, you don't have to give them up completely. Instead, allow these foods to be a reward when you have done well. Allowing yourself to still have special treats every once in a while is a nice way to tell yourself thank you for working hard to keep yourself healthy.   Also remember that every day is a new day. If you have a bad day and "fall off the wagon", you can still climb right back up and keep moving along on your journey!  We have resources available to help you!  Some websites that may be helpful  include: . www.http://carter.biz/  . Www.VeryWellFit.com _________________________________________________________________________

## 2020-11-10 NOTE — Assessment & Plan Note (Signed)
History of impaired fasting glucose with no diagnosis of diabetes. She will be due for labs in June of this year with her CPE and we will monitor at that time with A1c.  Recommend dietary changes to include high fiber, lean meats, low carbohydrates, and low saturated fats for positive impact on overall health.  Recommend regular physical activity of at least 20 minutes per day 5 days a week with purposeful brisk walking or similar.  No warning signs present today.  Will monitor at follow-up visit.

## 2020-11-10 NOTE — Assessment & Plan Note (Signed)
History of HLD diet controlled. Will order labs with CPE for evaluation. Dietary recommendations provided.
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
## 2020-11-10 NOTE — Assessment & Plan Note (Signed)
Mild intermittent asthma with exacerbations limited to illness and increase in allergy symptoms. No symptoms present at this time. No wheezes on auscultation, tripoding, or decreased oxygen saturations.  Recommend keeping inhalers with her in the event that her symptoms do flair.  She is aware that she can contact the office if refills are needed on her medications.  Follow-up if she begins to have a flair.

## 2020-11-10 NOTE — Assessment & Plan Note (Signed)
History of vitamin d deficiency with no current medications.  Will order labs with CPE to monitor for need of replacement.

## 2020-11-10 NOTE — Assessment & Plan Note (Signed)
New patient to practice Will work to obtain medical history from Dr. Rosalene Billings in [place].  Review of current and past medical history, surgical history, medications, family history, and SDOH complete.  Health maintenance reviewed and will be updated once previous records received.  CPE due in June- labs scheduled today.  Follow-up sooner if needed.

## 2020-11-24 ENCOUNTER — Encounter: Payer: Self-pay | Admitting: Podiatry

## 2020-12-03 ENCOUNTER — Ambulatory Visit: Payer: BC Managed Care – PPO | Admitting: Podiatry

## 2020-12-08 ENCOUNTER — Encounter (HOSPITAL_BASED_OUTPATIENT_CLINIC_OR_DEPARTMENT_OTHER): Payer: Self-pay | Admitting: Nurse Practitioner

## 2020-12-09 NOTE — Progress Notes (Signed)
Acute Office Visit  Subjective:    Patient ID: Cassidy Leach, female    DOB: [birth year], 50 y.o.   MRN: [id number]  Chief Complaint  Patient presents with  . Hand Pain    Patient states right ring finger is swollen and hurts when touched.  Patient states she has a cough mostly at night with clear phlegm.  . Cough    Patient has been taking Tessalon pearls, but not working.    HPI Patient is in today for inflammation and nodule to the palmar surface of the third finger on the right hand that has been present for about a week.  Reports the nodule is sore to touch and some mild swelling noted. No injury, infection, drainage, redness, or fevers.  Reports redness and irritations to the groin and panus with increased movement and sweating.  Endorses increased friction d/t excess skin from weight loss.  Reports the skin will burn after repeated friction. Also notes erythematous, pinpoint rash in the groin and center of abdomen in folds.  Has been using baby powder to keep area dry during the day.   Increased mucous production and worse at night due to allergies and asthma. Reports clear mucous production with no fevers, cough, and wheezing Recently returned from [place] vacation and symptoms worsened Mild SHOB on exertion.  Had two left over prednisone and these helped symptoms significantly.  Past Medical History:  Diagnosis Date  . Allergy    Seasonal  . Asthma   . Hyperlipidemia     Past Surgical History:  Procedure Laterality Date  . ABDOMINAL HYSTERECTOMY    . TUBAL LIGATION      History reviewed. No pertinent family history.  Social History   Socioeconomic History  . Marital status: Single    Spouse name: Not on file  . Number of children: 3  . Years of education: Not on file  . Highest education level: Not on file  Occupational History  . Not on file  Tobacco Use  . Smoking status: Never Smoker  . Smokeless tobacco: Never Used  Vaping Use  . Vaping  Use: Never used  Substance and Sexual Activity  . Alcohol use: Yes    Alcohol/week: 2.0 standard drinks    Types: 2 Standard drinks or equivalent per week  . Drug use: Never  . Sexual activity: Not Currently    Partners: Male    Birth control/protection: Abstinence  Other Topics Concern  . Not on file  Social History Narrative  . Not on file   Social Determinants of Health   Financial Resource Strain: Not on file  Food Insecurity: Not on file  Transportation Needs: Not on file  Physical Activity: Not on file  Stress: Not on file  Social Connections: Moderately Integrated  . Frequency of Communication with Friends and Family: More than three times a week  . Frequency of Social Gatherings with Friends and Family: More than three times a week  . Attends Religious Services: 1 to 4 times per year  . Active Member of Clubs or Organizations: Yes  . Attends Archivist Meetings: 1 to 4 times per year  . Marital Status: Never married  Intimate Partner Violence: Not At Risk  . Fear of Current or Ex-Partner: No  . Emotionally Abused: No  . Physically Abused: No  . Sexually Abused: No    Outpatient Medications Prior to Visit  Medication Sig Dispense Refill  . benzonatate (TESSALON) 100 MG capsule Take by mouth.    Marland Kitchen  azelastine (ASTELIN) 0.1 % nasal spray Place 2 sprays into both nostrils 2 (two) times daily. Use in each nostril as directed 30 mL 0  . azelastine (OPTIVAR) 0.05 % ophthalmic solution Place 1 drop into both eyes 2 (two) times daily. 6 mL 0  . benzonatate (TESSALON) 100 MG capsule Take 100 mg by mouth 3 (three) times daily.    . cetirizine (ZYRTEC) 10 MG tablet Take 1 tablet by mouth daily.    Marland Kitchen erythromycin ophthalmic ointment Place 1 application into both eyes at bedtime. 3.5 g 0  . fluticasone (FLONASE) 50 MCG/ACT nasal spray Place into both nostrils.    Marland Kitchen gabapentin (NEURONTIN) 300 MG capsule Take 1 capsule (300 mg total) by mouth at bedtime for 7 days, THEN 1  capsule (300 mg total) 2 (two) times daily for 7 days, THEN 1 capsule (300 mg total) 3 (three) times daily for 7 days. 42 capsule 2  . levocetirizine (XYZAL) 5 MG tablet Take 1 tablet (5 mg total) by mouth every evening. 30 tablet 0  . lidocaine (XYLOCAINE) 5 % ointment Apply 1 application topically as needed. 30 g 1  . omeprazole (PRILOSEC) 40 MG capsule Take 40 mg by mouth daily.    Vladimir Faster Glycol-Propyl Glycol (SYSTANE) 0.4-0.3 % SOLN 1-2 drops per eye twice daily as needed 15 mL 0   No facility-administered medications prior to visit.    Not on File  Review of Systems All review of systems negative except what is listed in the HPI     Objective:    Physical Exam Vitals and nursing note reviewed.  Constitutional:      Appearance: Normal appearance. She is obese.  HENT:     Head: Normocephalic.  Cardiovascular:     Rate and Rhythm: Normal rate and regular rhythm.     Pulses: Normal pulses.     Heart sounds: Normal heart sounds.  Pulmonary:     Effort: Pulmonary effort is normal.     Breath sounds: Wheezing present.  Musculoskeletal:       Arms:     Cervical back: Normal range of motion.     Comments: Pulses evaluated with doppler and full audibility present from most distal tip of third finger to the wrist. Capillary refill <2sec. Skin warm and intact. No discoloration noted. No obvious injury noted. Mild decreased strength in single finger.   Skin:    General: Skin is warm and dry.     Capillary Refill: Capillary refill takes less than 2 seconds.     Findings: Rash present.     Comments: Erythematous pinpoint rash to the abdomen in skin folds consistent with candida.  Neurological:     General: No focal deficit present.     Mental Status: She is alert and oriented to person, place, and time.  Psychiatric:        Mood and Affect: Mood normal.        Behavior: Behavior normal.        Thought Content: Thought content normal.        Judgment: Judgment normal.      BP 121/70   Pulse (!) 102   Ht 5' 7"  (1.702 m)   Wt 233 lb 9.6 oz (106 kg)   SpO2 97%   BMI 36.59 kg/m  Wt Readings from Last 3 Encounters:  12/10/20 233 lb 9.6 oz (106 kg)  11/10/20 230 lb 12.8 oz (104.7 kg)    Health Maintenance Due  Topic Date Due  .  HIV Screening  Never done  . Hepatitis C Screening  Never done  . COLONOSCOPY (Pts 45-22yr Insurance coverage will need to be confirmed)  Never done  . MAMMOGRAM  Never done  . COVID-19 Vaccine (3 - Booster for Moderna series) 03/09/2020    There are no preventive care reminders to display for this patient.   No results found for: TSH No results found for: WBC, HGB, HCT, MCV, PLT No results found for: NA, K, CHLORIDE, CO2, GLUCOSE, BUN, CREATININE, BILITOT, ALKPHOS, AST, ALT, PROT, ALBUMIN, CALCIUM, ANIONGAP, EGFR, GFR No results found for: CHOL No results found for: HDL No results found for: LDLCALC No results found for: TRIG No results found for: CHOLHDL No results found for: HGBA1C     Assessment & Plan:   Problem List Items Addressed This Visit    Asthma, mild intermittent    Sx and presentation consistent with asthma exacerbation most likely related to recent climate change/travel. No alarm signs present today. Wheezes auscultated bilaterally in bases on lungs.  Recommend mucinex at bedtime with regular allergy medications to help with symptoms Will send for 5 day prednisone burst given that recent prednisone dosages were helpful. F/U if symptoms worsen or fail to improve.       Relevant Medications   predniSONE (DELTASONE) 50 MG tablet   Candida infection    Erythematous macular pinpoint rash to abdomen and groin in moisture-rich area. Recommend frequent washing and thorough drying of areas between all skin folds and groin with clean towel.  Avoid overheating. Application of barrier cream or ointment similar to Body Glide to help reduce friction Nystatin powder twice a day to rash for  candida. Monitor for worsening or changing symptoms.  F/U if symptoms worsen or fail to improve.       Relevant Medications   nystatin (NYSTATIN) powder   Nodule of finger of right hand - Primary    Nodule to the tendon of the right third finger on the palmar side. No signs of infection or decrease in circulation.  Consider possible ganglion cyst or tendonitis given the presentation.  Recommend immobilization of finger, ice, NSAIDs x3 days then daily finger exercises to help with pain.  Continue until CPE or follow-up if symptoms worsen.  May need ultrasound if symptoms are not improved with conservative treatment.           Meds ordered this encounter  Medications  . nystatin (NYSTATIN) powder    Sig: Apply 1 application topically 3 (three) times daily.    Dispense:  15 g    Refill:  0  . predniSONE (DELTASONE) 50 MG tablet    Sig: Take 1 tablet (50 mg total) by mouth daily with breakfast.    Dispense:  5 tablet    Refill:  0   F/U with CPE or sooner if needed  SOrma Render NP

## 2020-12-10 ENCOUNTER — Encounter (HOSPITAL_BASED_OUTPATIENT_CLINIC_OR_DEPARTMENT_OTHER): Payer: Self-pay | Admitting: Nurse Practitioner

## 2020-12-10 ENCOUNTER — Ambulatory Visit (HOSPITAL_BASED_OUTPATIENT_CLINIC_OR_DEPARTMENT_OTHER): Payer: BC Managed Care – PPO | Admitting: Nurse Practitioner

## 2020-12-10 ENCOUNTER — Ambulatory Visit: Payer: BC Managed Care – PPO | Admitting: Podiatry

## 2020-12-10 ENCOUNTER — Other Ambulatory Visit: Payer: Self-pay

## 2020-12-10 VITALS — BP 121/70 | HR 102 | Ht 67.0 in | Wt 233.6 lb

## 2020-12-10 DIAGNOSIS — B379 Candidiasis, unspecified: Secondary | ICD-10-CM | POA: Insufficient documentation

## 2020-12-10 DIAGNOSIS — R2231 Localized swelling, mass and lump, right upper limb: Secondary | ICD-10-CM

## 2020-12-10 DIAGNOSIS — J4521 Mild intermittent asthma with (acute) exacerbation: Secondary | ICD-10-CM | POA: Diagnosis not present

## 2020-12-10 MED ORDER — PREDNISONE 50 MG PO TABS: 50.0000 mg | ORAL_TABLET | Freq: Every day | ORAL | 0 refills | Status: DC

## 2020-12-10 MED ORDER — NYSTATIN 100000 UNIT/GM EX POWD: 1.0000 "application " | Freq: Three times a day (TID) | CUTANEOUS | 0 refills | Status: DC

## 2020-12-10 NOTE — Assessment & Plan Note (Signed)
Sx and presentation consistent with asthma exacerbation most likely related to recent climate change/travel. No alarm signs present today. Wheezes auscultated bilaterally in bases on lungs.  Recommend mucinex at bedtime with regular allergy medications to help with symptoms Will send for 5 day prednisone burst given that recent prednisone dosages were helpful. F/U if symptoms worsen or fail to improve.

## 2020-12-10 NOTE — Patient Instructions (Addendum)
Body Glide can help with the chaffing and friction.  Use the nystatin powder at least every night. May also use in the morning for the rash.   I believe that this is either a ganglion cyst or tendonitis of the tendon in the finger. I recommend you use ice20 minutes and heat 20 minutes to the finger 2-3 times a day and also do the exercises.  When you are using the hand during the day, keep the finger immobilized. We will recheck next week.  Calcific Tendinitis  Calcific tendinitis occurs when crystals of calcium are deposited in a tendon. Tendons are tough, cord-like tissues that connect muscle to bone. Tendons are an important part of joints. They make joints move, and they absorb some of the stress that a joint receives during use. When calcium is deposited in the tendon, the tendon becomes stiff and painful and may become swollen. Calcific tendinitis often occurs in a tendon in the rotator cuff. The rotator cuff is a group of muscles and tendons in the shoulder joint. What are the causes? The cause of calcific tendinitis is not known. It may be associated with:  Overusing a tendon, such as from repetitive motion.  Too much stress on the tendon.  Age-related wear and tear.  Repetitive, mild injuries. What increases the risk? This condition is more likely to develop in:  People who do activities that involve repetitive motions.  Older people. What are the signs or symptoms? Symptoms of this condition include:  Pain. This condition may or may not be painful. If there is pain, it may occur when moving the joint.  Tenderness when pressure is applied to the tendon.  A snapping or popping sound when the joint moves.  Decreased motion of the joint.  Difficulty sleeping due to pain in the joint. How is this diagnosed? This condition is diagnosed with a physical exam. You may also have tests, such as:  X-rays.  MRI.  CT scan. How is this treated? This condition generally gets  better on its own. Treatment may also include:  Resting, icing, applying pressure (compression), and raising (elevating) the area above the level of your heart. This is known as RICE therapy.  Applying heat to the affected area.  Medicines to help reduce inflammation or pain.  Physical therapy to strengthen and stretch the tendon.  Following a specific exercise program to keep the joint working properly. In severe cases, treatment may include:  Injecting steroids or pain-relieving medicines into or around the joint.  Manipulating the joint after you are given medicine to numb the area (local anesthetic).  Inflating the joint with sterile fluid to increase the flexibility of the tendons.  Having shock wave therapy, which involves focusing sound waves on the joint. If other treatments do not work, surgery may be needed to clean out the calcium deposits and repair the tendon. Most people do not need surgery. Follow these instructions at home: Managing pain, stiffness, and swelling  If directed, put ice on the affected area. To do this: ? Put ice in a plastic bag. ? Place a towel between your skin and the bag. ? Leave the ice on for 20 minutes, 2-3 times a day.  If directed, apply heat to the affected area before you exercise or as often as told by your health care provider. Use the heat source that your health care provider recommends, such as a moist heat pack or a heating pad. ? Place a towel between your skin and the  heat source. ? Leave the heat on for 20-30 minutes. ? Remove the heat if your skin turns bright red. This is especially important if you are unable to feel pain, heat, or cold. You may have a greater risk of getting burned.  Move the fingers or toes of the affected limb often. This can help to reduce stiffness and swelling.  Raise (elevate) the injured area above the level of your heart while you are sitting or lying down.      Medicines  Take over-the-counter  and prescription medicines only as told by your health care provider.  Ask your health care provider if the medicine prescribed to you requires you to avoid driving or using heavy machinery. General instructions  Do not use any products that contain nicotine or tobacco, such as cigarettes, e-cigarettes, and chewing tobacco. These can delay healing. If you need help quitting, ask your health care provider.  Follow recommendations from your health care provider about activity and exercise. Ask your health care provider what activities are safe for you.  Avoid using the affected area while you are experiencing symptoms of tendinitis.  Wear an elastic bandage or compression wrap as told by your health care provider.  Keep all follow-up visits as told by your health care provider. This is important. Contact a health care provider if you:  Have pain or numbness that gets worse.  Develop new weakness.  Notice increased joint stiffness or a sensation of looseness in the joint.  Notice increasing redness, swelling, or warmth around the joint area.  Have a fever and your symptoms suddenly get worse. Summary  Calcific tendinitis occurs when crystals of calcium are deposited in a tendon.  Calcific tendinitis occurs frequently in a tendon in the rotator cuff, which is a group of muscles and tendons in the shoulder joint.  This condition may or may not be painful.  This condition generally gets better on its own.  Treatment may include rest, ice, medicines, physical therapy, and in rare cases surgery. This information is not intended to replace advice given to you by your health care provider. Make sure you discuss any questions you have with your health care provider. Document Revised: 05/23/2019 Document Reviewed: 05/23/2019 Elsevier Patient Education  2021 [employer].

## 2020-12-10 NOTE — Assessment & Plan Note (Addendum)
Nodule to the tendon of the right third finger on the palmar side. No signs of infection or decrease in circulation.  Consider possible ganglion cyst or tendonitis given the presentation.  Recommend immobilization of finger, ice, NSAIDs x3 days then daily finger exercises to help with pain.  Continue until CPE or follow-up if symptoms worsen.  May need ultrasound if symptoms are not improved with conservative treatment.

## 2020-12-10 NOTE — Assessment & Plan Note (Signed)
Erythematous macular pinpoint rash to abdomen and groin in moisture-rich area. Recommend frequent washing and thorough drying of areas between all skin folds and groin with clean towel.  Avoid overheating. Application of barrier cream or ointment similar to Body Glide to help reduce friction Nystatin powder twice a day to rash for candida. Monitor for worsening or changing symptoms.  F/U if symptoms worsen or fail to improve.

## 2020-12-11 ENCOUNTER — Encounter (HOSPITAL_BASED_OUTPATIENT_CLINIC_OR_DEPARTMENT_OTHER): Payer: Self-pay | Admitting: Nurse Practitioner

## 2020-12-14 ENCOUNTER — Other Ambulatory Visit (HOSPITAL_BASED_OUTPATIENT_CLINIC_OR_DEPARTMENT_OTHER)
Admission: RE | Admit: 2020-12-14 | Discharge: 2020-12-14 | Disposition: A | Discharge status: Home / Self Care | Payer: BC Managed Care – PPO | Source: Ambulatory Visit | Attending: Nurse Practitioner | Admitting: Nurse Practitioner

## 2020-12-14 ENCOUNTER — Ambulatory Visit (INDEPENDENT_AMBULATORY_CARE_PROVIDER_SITE_OTHER): Payer: BC Managed Care – PPO

## 2020-12-14 ENCOUNTER — Telehealth (HOSPITAL_BASED_OUTPATIENT_CLINIC_OR_DEPARTMENT_OTHER): Payer: Self-pay | Admitting: Nurse Practitioner

## 2020-12-14 ENCOUNTER — Ambulatory Visit: Payer: BC Managed Care – PPO | Admitting: Podiatry

## 2020-12-14 ENCOUNTER — Other Ambulatory Visit: Payer: Self-pay

## 2020-12-14 DIAGNOSIS — M2011 Hallux valgus (acquired), right foot: Secondary | ICD-10-CM

## 2020-12-14 DIAGNOSIS — J301 Allergic rhinitis due to pollen: Secondary | ICD-10-CM | POA: Diagnosis not present

## 2020-12-14 DIAGNOSIS — T8484XA Pain due to internal orthopedic prosthetic devices, implants and grafts, initial encounter: Secondary | ICD-10-CM | POA: Diagnosis not present

## 2020-12-14 DIAGNOSIS — M21611 Bunion of right foot: Secondary | ICD-10-CM | POA: Diagnosis not present

## 2020-12-14 DIAGNOSIS — R7301 Impaired fasting glucose: Secondary | ICD-10-CM | POA: Diagnosis not present

## 2020-12-14 DIAGNOSIS — K219 Gastro-esophageal reflux disease without esophagitis: Secondary | ICD-10-CM

## 2020-12-14 DIAGNOSIS — E559 Vitamin D deficiency, unspecified: Secondary | ICD-10-CM

## 2020-12-14 DIAGNOSIS — E785 Hyperlipidemia, unspecified: Secondary | ICD-10-CM | POA: Diagnosis not present

## 2020-12-14 DIAGNOSIS — J4520 Mild intermittent asthma, uncomplicated: Secondary | ICD-10-CM

## 2020-12-14 LAB — CBC WITH DIFFERENTIAL/PLATELET
Abs Immature Granulocytes: 0.02 10*3/uL (ref 0.00–0.07)
Basophils Absolute: 0 10*3/uL (ref 0.0–0.1)
Basophils Relative: 0 %
Eosinophils Absolute: 0.1 10*3/uL (ref 0.0–0.5)
Eosinophils Relative: 3 %
HCT: 39.3 % (ref 36.0–46.0)
Hemoglobin: 12.8 g/dL (ref 12.0–15.0)
Immature Granulocytes: 0 %
Lymphocytes Relative: 58 %
Lymphs Abs: 3 10*3/uL (ref 0.7–4.0)
MCH: 28.6 pg (ref 26.0–34.0)
MCHC: 32.6 g/dL (ref 30.0–36.0)
MCV: 87.9 fL (ref 80.0–100.0)
Monocytes Absolute: 0.3 10*3/uL (ref 0.1–1.0)
Monocytes Relative: 5 %
Neutro Abs: 1.7 10*3/uL (ref 1.7–7.7)
Neutrophils Relative %: 34 %
Platelets: 262 10*3/uL (ref 150–400)
RBC: 4.47 MIL/uL (ref 3.87–5.11)
RDW: 13.5 % (ref 11.5–15.5)
WBC: 5.2 10*3/uL (ref 4.0–10.5)
nRBC: 0 % (ref 0.0–0.2)

## 2020-12-14 LAB — COMPREHENSIVE METABOLIC PANEL
ALT: 14 U/L (ref 0–44)
AST: 13 U/L — ABNORMAL LOW (ref 15–41)
Albumin: 3.9 g/dL (ref 3.5–5.0)
Alkaline Phosphatase: 42 U/L (ref 38–126)
Anion gap: 7 (ref 5–15)
BUN: 12 mg/dL (ref 6–20)
CO2: 28 mmol/L (ref 22–32)
Calcium: 8.3 mg/dL — ABNORMAL LOW (ref 8.9–10.3)
Chloride: 104 mmol/L (ref 98–111)
Creatinine, Ser: 0.73 mg/dL (ref 0.44–1.00)
GFR, Estimated: >60 mL/min (ref >60)
Glucose, Bld: 124 mg/dL — ABNORMAL HIGH (ref 70–99)
Potassium: 3.7 mmol/L (ref 3.5–5.1)
Sodium: 139 mmol/L (ref 135–145)
Total Bilirubin: 0.4 mg/dL (ref 0.3–1.2)
Total Protein: 7 g/dL (ref 6.5–8.1)

## 2020-12-14 LAB — LIPID PANEL
Cholesterol: 215 mg/dL — ABNORMAL HIGH (ref 0–200)
HDL: 36 mg/dL — ABNORMAL LOW (ref >40)
LDL Cholesterol: 151 mg/dL — ABNORMAL HIGH (ref 0–99)
Total CHOL/HDL Ratio: 6 RATIO
Triglycerides: 138 mg/dL (ref <150)
VLDL: 28 mg/dL (ref 0–40)

## 2020-12-14 LAB — HEMOGLOBIN A1C
Hgb A1c MFr Bld: 6.7 % — ABNORMAL HIGH (ref 4.8–5.6)
Mean Plasma Glucose: 145.59 mg/dL

## 2020-12-14 LAB — VITAMIN D 25 HYDROXY (VIT D DEFICIENCY, FRACTURES): Vit D, 25-Hydroxy: 14.56 ng/mL — ABNORMAL LOW (ref 30–100)

## 2020-12-14 NOTE — Telephone Encounter (Signed)
Received an after hours fax from 5/20 about pts cough and stating that when she "coughs hard she will stop breathing for a second" Placed faxed with DNP [name] early's CMA in white tray in the back. Please advise.

## 2020-12-15 ENCOUNTER — Encounter: Payer: Self-pay | Admitting: Podiatry

## 2020-12-15 NOTE — Progress Notes (Signed)
  Subjective:  Patient ID: Cassidy Leach, female    DOB: [birth year],  MRN: [id number]  Postop follow-up from right foot hallux valgus correction 07/31/20  50 y.o. female presents with the above complaint. History confirmed with patient.  Overall doing fairly well she still having some numbness.  Would like to get this removed at this point  Objective:  Physical Exam: Foot warm and well-perfused with palpable pulses in the right side.  Toe remains in a rectus position.  Well-healed surgical incisions.  The proximal portion of the distal metatarsal screw is palpable  No images are attached to the encounter.  Radiographs: X-ray of right foot show that the osteotomy is well-healed with hardware intact and in position, the chamfered portion of the distal metatarsal screw is prominent Assessment:   1. Hallux valgus with bunions, right   2. Pain due to internal orthopedic prosthetic devices, implants and grafts, initial encounter [facility])      Plan:  Patient was evaluated and treated and all questions answered.  Reviewed the radiographs with her.  We discussed further treatment including removal of the prominent screw.  The proximal metatarsal region screw require removal at this point.  We discussed the risk and benefits of this including further nerve injury or possible infection.  Also discussed postop protocol.  Plan to be in surgical shoe until stitches heal 1 to 2 weeks.  Informed consent was signed and reviewed.  Surgery be scheduled at a mutually agreeable date.  No follow-ups on file.

## 2020-12-16 ENCOUNTER — Encounter (HOSPITAL_BASED_OUTPATIENT_CLINIC_OR_DEPARTMENT_OTHER): Payer: Self-pay | Admitting: Nurse Practitioner

## 2020-12-16 ENCOUNTER — Telehealth (HOSPITAL_BASED_OUTPATIENT_CLINIC_OR_DEPARTMENT_OTHER): Payer: Self-pay

## 2020-12-16 NOTE — Telephone Encounter (Signed)
-----   Message from [name], NP sent at 12/16/2020 10:36 AM EDT ----- Please call patient to schedule New Diabetes, New Hyperlipidemia F/U visit (60 minute visit) in the near future to discuss lifestyle and diet recommendations, as well as medications.   Vitamin D and calcium levels low. Recommend daily supplement of Vitamin D3 2000iU and Calcium 1200mg  daily. Increase calcium rich foods in diet.   Cholesterol is elevated. Recommend starting moderate intensity statin for cardiovascular health in the setting of elevated A1c. Simvastatin 20mg  x 2 weeks then increase to 40mg  taken at bedtime. Diet low in saturated fats and carbohydrates recommended with daily walks of at least 20 minutes. Plan to recheck in 12 weeks.   Hemoglobin A1c indicates diabetes at 6.7%. Recommend starting metformin 500mg  BID x 2weeks then increase to 1000mg  BID. Diet low in saturated fats and carbohydrates with recommended daily walks of at least 20 minutes. Plan to recheck in 12 weeks.   No signs of anemia or infection present. Kidney and liver function good.

## 2020-12-16 NOTE — Telephone Encounter (Signed)
Called patient to go over lab results and recommendations.  She already has an appointment scheduled for 12/22/20 @ 2:30 for new diabetes with [name], AGNP.

## 2020-12-16 NOTE — Progress Notes (Signed)
Please call patient to schedule New Diabetes, New Hyperlipidemia F/U visit (60 minute visit) in the near future to discuss lifestyle and diet recommendations, as well as medications.   Vitamin D and calcium levels low. Recommend daily supplement of Vitamin D3 2000iU and Calcium 1200mg  daily. Increase calcium rich foods in diet.   Cholesterol is elevated. Recommend starting moderate intensity statin for cardiovascular health in the setting of elevated A1c. Simvastatin 20mg  x 2 weeks then increase to 40mg  taken at bedtime. Diet low in saturated fats and carbohydrates recommended with daily walks of at least 20 minutes. Plan to recheck in 12 weeks.   Hemoglobin A1c indicates diabetes at 6.7%. Recommend starting metformin 500mg  BID x 2weeks then increase to 1000mg  BID. Diet low in saturated fats and carbohydrates with recommended daily walks of at least 20 minutes. Plan to recheck in 12 weeks.   No signs of anemia or infection present. Kidney and liver function good.

## 2020-12-17 ENCOUNTER — Encounter (HOSPITAL_BASED_OUTPATIENT_CLINIC_OR_DEPARTMENT_OTHER): Payer: BC Managed Care – PPO | Admitting: Nurse Practitioner

## 2020-12-22 ENCOUNTER — Encounter (HOSPITAL_BASED_OUTPATIENT_CLINIC_OR_DEPARTMENT_OTHER): Payer: Self-pay | Admitting: Nurse Practitioner

## 2020-12-22 ENCOUNTER — Ambulatory Visit (HOSPITAL_BASED_OUTPATIENT_CLINIC_OR_DEPARTMENT_OTHER): Payer: BC Managed Care – PPO | Admitting: Nurse Practitioner

## 2020-12-22 ENCOUNTER — Other Ambulatory Visit: Payer: Self-pay

## 2020-12-22 VITALS — BP 117/76 | HR 100 | Ht 67.0 in | Wt 235.0 lb

## 2020-12-22 DIAGNOSIS — E785 Hyperlipidemia, unspecified: Secondary | ICD-10-CM

## 2020-12-22 DIAGNOSIS — E1169 Type 2 diabetes mellitus with other specified complication: Secondary | ICD-10-CM

## 2020-12-22 DIAGNOSIS — E1165 Type 2 diabetes mellitus with hyperglycemia: Secondary | ICD-10-CM

## 2020-12-22 DIAGNOSIS — E559 Vitamin D deficiency, unspecified: Secondary | ICD-10-CM | POA: Diagnosis not present

## 2020-12-22 MED ORDER — METFORMIN HCL 500 MG PO TABS: ORAL_TABLET | ORAL | 2 refills | Status: DC

## 2020-12-22 MED ORDER — ROSUVASTATIN CALCIUM 20 MG PO TABS: 20.0000 mg | ORAL_TABLET | Freq: Every day | ORAL | 3 refills | Status: DC

## 2020-12-22 MED ORDER — VITAMIN D (ERGOCALCIFEROL) 1.25 MG (50000 UNIT) PO CAPS: 50000.0000 [IU] | ORAL_CAPSULE | ORAL | 0 refills | Status: DC

## 2020-12-22 MED ORDER — BLOOD GLUCOSE METER KIT: PACK | 99 refills | Status: AC

## 2020-12-22 MED ORDER — LISINOPRIL 2.5 MG PO TABS
2.5000 mg | ORAL_TABLET | Freq: Every day | ORAL | 3 refills | Status: DC
Start: 2020-12-22 — End: 2020-12-22

## 2020-12-22 NOTE — Assessment & Plan Note (Signed)
HLD in the setting of DM2. Start rosuvastatin 20mg  and plan to recheck labs in 3 months with f/u Education on diet and exercise provided as well as medication. F/U sooner with any concerning symptoms.

## 2020-12-22 NOTE — Progress Notes (Signed)
Established Patient Office Visit  Subjective:  Patient ID: Cassidy Leach, female    DOB: [birth year]  Age: 50 y.o. MRN: [id number]  CC:  Chief Complaint  Patient presents with  . Follow-up  . Diabetes    HPI Cassidy Leach presents for follow-up for new diabetes and new hyperlipidemia.  Recent A1c 6.7% Hx of bariatric surgery  Increased nocturia and slow wound healing noted from previous surgery prior to knowledge of diabetes reported. No other concerning symptoms present.    Past Medical History:  Diagnosis Date  . Allergy    Seasonal  . Asthma   . Hyperlipidemia     Past Surgical History:  Procedure Laterality Date  . ABDOMINAL HYSTERECTOMY    . TUBAL LIGATION      History reviewed. No pertinent family history.  Social History   Socioeconomic History  . Marital status: Single    Spouse name: Not on file  . Number of children: 3  . Years of education: Not on file  . Highest education level: Not on file  Occupational History  . Not on file  Tobacco Use  . Smoking status: Never Smoker  . Smokeless tobacco: Never Used  Vaping Use  . Vaping Use: Never used  Substance and Sexual Activity  . Alcohol use: Yes    Alcohol/week: 2.0 standard drinks    Types: 2 Standard drinks or equivalent per week  . Drug use: Never  . Sexual activity: Not Currently    Partners: Male    Birth control/protection: Abstinence  Other Topics Concern  . Not on file  Social History Narrative  . Not on file   Social Determinants of Health   Financial Resource Strain: Not on file  Food Insecurity: Not on file  Transportation Needs: Not on file  Physical Activity: Not on file  Stress: Not on file  Social Connections: Moderately Integrated  . Frequency of Communication with Friends and Family: More than three times a week  . Frequency of Social Gatherings with Friends and Family: More than three times a week  . Attends Religious Services: 1 to 4 times per year  . Active  Member of Clubs or Organizations: Yes  . Attends Archivist Meetings: 1 to 4 times per year  . Marital Status: Never married  Intimate Partner Violence: Not At Risk  . Fear of Current or Ex-Partner: No  . Emotionally Abused: No  . Physically Abused: No  . Sexually Abused: No    Outpatient Medications Prior to Visit  Medication Sig Dispense Refill  . azelastine (ASTELIN) 0.1 % nasal spray Place 2 sprays into both nostrils 2 (two) times daily. Use in each nostril as directed 30 mL 0  . azelastine (OPTIVAR) 0.05 % ophthalmic solution Place 1 drop into both eyes 2 (two) times daily. 6 mL 0  . benzonatate (TESSALON) 100 MG capsule Take 100 mg by mouth 3 (three) times daily.    . cetirizine (ZYRTEC) 10 MG tablet Take 1 tablet by mouth daily.    Marland Kitchen erythromycin ophthalmic ointment Place 1 application into both eyes at bedtime. 3.5 g 0  . fluticasone (FLONASE) 50 MCG/ACT nasal spray Place into both nostrils.    Marland Kitchen gabapentin (NEURONTIN) 300 MG capsule TAKE 1 CAPSULE BY MOUTH AT BEDTIME FOR 7 DAYS THEN TAKE 1 CAPSULE TWICE DAILY FOR 7 DAYS THEN TAKE 1 CAPSULE THREE TIMES DAILY FOR 7 DAYS    . lidocaine (XYLOCAINE) 5 % ointment Apply 1 application topically as needed. [place]  g 1  . nystatin (NYSTATIN) powder Apply 1 application topically 3 (three) times daily. 15 g 0  . omeprazole (PRILOSEC) 40 MG capsule Take 40 mg by mouth daily.    Vladimir Faster Glycol-Propyl Glycol (SYSTANE) 0.4-0.3 % SOLN 1-2 drops per eye twice daily as needed 15 mL 0  . benzonatate (TESSALON) 100 MG capsule Take by mouth.    . predniSONE (DELTASONE) 50 MG tablet Take 1 tablet (50 mg total) by mouth daily with breakfast. 5 tablet 0  . gabapentin (NEURONTIN) 300 MG capsule Take 1 capsule (300 mg total) by mouth at bedtime for 7 days, THEN 1 capsule (300 mg total) 2 (two) times daily for 7 days, THEN 1 capsule (300 mg total) 3 (three) times daily for 7 days. 42 capsule 2  . levocetirizine (XYZAL) 5 MG tablet Take 1 tablet (5 mg  total) by mouth every evening. 30 tablet 0   No facility-administered medications prior to visit.    Not on File  ROS Review of Systems All review of systems negative except what is listed in the HPI    Objective:    Physical Exam Vitals and nursing note reviewed.  Constitutional:      Appearance: Normal appearance. She is obese.  HENT:     Head: Normocephalic and atraumatic.  Eyes:     Extraocular Movements: Extraocular movements intact.     Conjunctiva/sclera: Conjunctivae normal.     Pupils: Pupils are equal, round, and reactive to light.  Cardiovascular:     Rate and Rhythm: Normal rate.     Pulses: Normal pulses.  Pulmonary:     Effort: Pulmonary effort is normal.  Musculoskeletal:        General: Normal range of motion.     Cervical back: Normal range of motion.     Right lower leg: No edema.     Left lower leg: No edema.  Skin:    General: Skin is warm and dry.     Capillary Refill: Capillary refill takes less than 2 seconds.  Neurological:     General: No focal deficit present.     Mental Status: She is alert and oriented to person, place, and time.  Psychiatric:        Mood and Affect: Mood normal.        Behavior: Behavior normal.        Thought Content: Thought content normal.        Judgment: Judgment normal.     BP 117/76   Pulse 100   Ht _0  (1.702 m)   Wt 235 lb (106.6 kg)   SpO2 99%   BMI 36.81 kg/m  Wt Readings from Last 3 Encounters:  12/22/20 235 lb (106.6 kg)  12/10/20 233 lb 9.6 oz (106 kg)  11/10/20 230 lb 12.8 oz (104.7 kg)     Health Maintenance Due  Topic Date Due  . FOOT EXAM  Never done  . OPHTHALMOLOGY EXAM  Never done  . URINE MICROALBUMIN  Never done  . HIV Screening  Never done  . Hepatitis C Screening  Never done  . COLONOSCOPY (Pts 45-52yr Insurance coverage will need to be confirmed)  Never done  . MAMMOGRAM  Never done  . Zoster Vaccines- Shingrix (2 of 2) 02/11/2020  . COVID-19 Vaccine (3 - Booster for  Moderna series) 03/09/2020    There are no preventive care reminders to display for this patient.  No results found for: TSH Lab Results  Component Value Date  WBC 5.2 12/14/2020   HGB 12.8 12/14/2020   HCT 39.3 12/14/2020   MCV 87.9 12/14/2020   PLT 262 12/14/2020   Lab Results  Component Value Date   NA 139 12/14/2020   K 3.7 12/14/2020   CO2 28 12/14/2020   GLUCOSE 124 (H) 12/14/2020   BUN 12 12/14/2020   CREATININE 0.73 12/14/2020   BILITOT 0.4 12/14/2020   ALKPHOS 42 12/14/2020   AST 13 (L) 12/14/2020   ALT 14 12/14/2020   PROT 7.0 12/14/2020   ALBUMIN 3.9 12/14/2020   CALCIUM 8.3 (L) 12/14/2020   ANIONGAP 7 12/14/2020   Lab Results  Component Value Date   CHOL 215 (H) 12/14/2020   Lab Results  Component Value Date   HDL 36 (L) 12/14/2020   Lab Results  Component Value Date   LDLCALC 151 (H) 12/14/2020   Lab Results  Component Value Date   TRIG 138 12/14/2020   Lab Results  Component Value Date   CHOLHDL 6.0 12/14/2020   Lab Results  Component Value Date   HGBA1C 6.7 (H) 12/14/2020      Assessment & Plan:   Problem List Items Addressed This Visit    Type 2 diabetes mellitus with hyperglycemia, without long-term current use of insulin (HCC) - Primary   Relevant Medications   rosuvastatin (CRESTOR) 20 MG tablet   metFORMIN (GLUCOPHAGE) 500 MG tablet   blood glucose meter kit and supplies   Hyperlipidemia associated with type 2 diabetes mellitus (HCC)   Relevant Medications   rosuvastatin (CRESTOR) 20 MG tablet   metFORMIN (GLUCOPHAGE) 500 MG tablet   Vitamin D deficiency   Relevant Medications   Vitamin D, Ergocalciferol, (DRISDOL) 1.25 MG (50000 UNIT) CAPS capsule      Meds ordered this encounter  Medications  . rosuvastatin (CRESTOR) 20 MG tablet    Sig: Take 1 tablet (20 mg total) by mouth daily.    Dispense:  90 tablet    Refill:  3  . metFORMIN (GLUCOPHAGE) 500 MG tablet    Sig: Start with 535m (1 tab) with dinner for 7 days,  then increase to 5072m(1 tab) with breakfast and 50076m1 tab) with dinner for 7 days, then increase to 500m22m tab) with breakfast and 1000mg27mtabs) with dinner daily.    Dispense:  90 tablet    Refill:  2    Dx Code E11.65  . blood glucose meter kit and supplies    Sig: Monitor blood sugar every morning before eating or drinking and write on log. May monitor at additional times during the day if having symptoms.    Dispense:  1 each    Refill:  99    Dispense based on patient and insurance preference. Use up to four times daily as directed. (FOR ICD-10 E10.9, E11.9).    Order Specific Question:   Number of strips    Answer:   99   29rder Specific Question:   Number of lancets    Answer:   99  . DISCONTD: lisinopril (ZESTRIL) 2.5 MG tablet    Sig: Take 1 tablet (2.5 mg total) by mouth daily.    Dispense:  30 tablet    Refill:  3  . Vitamin D, Ergocalciferol, (DRISDOL) 1.25 MG (50000 UNIT) CAPS capsule    Sig: Take 1 capsule (50,000 Units total) by mouth every 7 (seven) days. Take for 8 total doses(weeks)    Dispense:  8 capsule    Refill:  0  Follow-up: Return in about 3 months (around 03/24/2021) for DM.    Orma Render, NP

## 2020-12-22 NOTE — Patient Instructions (Addendum)
Goal Blood Pressure: 110/60-120/80 Goal Hemoglobin A1c: less than 6% Goal LDL Cholesterol: less than 70  Recommendations for diabetic patients_ we will talk more about these at a later visit: Pneumonia Vaccine Annual Dilated Eye Exam Annual Foot Exam Annual Urinary Microalbumin Test A1c every 3-6 months Lipids every 6 months Metabolic Panel (kidney function) every 6 months  Hemoglobin A1c: This lab test measures the amount of glucose (sugar) attached to your blood cells. It gives [place] an average of your blood sugars from the past 3 months and helps [place] determine how well controlled your blood sugars are.   Hemoglobin A1c (Hgb A1c) over 6.4% is considered Diabetic, 5.7-6.4% is considered pre-diabetic, less than 5.7% is considered normal.   Your most recent HgbA1c was: 6.7%  Management of type 2 diabetes is done through a combination of diet, exercise, and medications.  Diabetes is not simply "eating too much sugar", but a combination of events within your body that lead to decreased use of available glucose (sugar) in the blood.   Type 2 diabetes is often genetic, meaning you have certain markers in your family history that make you more susceptible to developing the condition.  This does not always mean that you will develop type 2 diabetes, but it does mean that the possibility is there.  Other factors include obesity, exercise, and other health conditions.   Insulin works as a "key" to open our cells to allow glucose into the cell to be used for energy.  In diabetes, some of cells no longer recognize the shape of the insulin to open them and allow for glucose to enter- essentially changing the locks. When this happens, the glucose we would normally use for energy in our cells remains in the blood stream and cannot be used. This is why our "blood sugar" goes up.  The brain believes that you don't have enough sugar and then causes your liver to spill more sugar from storage into your body-  creating a viscous cycle of elevated sugar levels.   This excess blood sugar is damaging to our nerves and organs in the body and creates a variety of complications that can lead to organ damage, neuropathy (nerve pain), and blindness.   Medications for type 2 diabetes help in different ways based on the medications.  Metformin, the first line medication for type 2 diabetes, helps our body to make more insulin so that the cells that are not changed can use the glucose. It also helps our body to better recognize the insulin (changing the locks back to normal).   By taking the medication and reducing the amount of glucose in your diet, you can protect against the damage from Type 2 diabetes and keep your blood sugars under better control.  Exercise also helps your cells burn off more glucose and use the excess glucose rather than have it sitting in the blood.  Weight reduction helps the cells become less insulin resistant and allows the insulin to unlock the cell like it is made to do.   This is just the very basics of Type 2 Diabetes, but hopefully it helps you understand what is happening in your body and helps you learn how to manage it a little better.   Foods that turn to glucose (sugar) in the body are called carbohydrates, these include: Candy, sweets, soda, fruit juice, any foods with sugar, pasta, breads, potatoes, and certain beans This is just a small, but more common list/  The key is to limit  carbohydrates in your diet- you don't have to cut them out completely, but just lessen the amount that you take in daily. Avoiding soda, juice, excess breads, and pastas area a great start.  Increasing your proteins with lean meats and low carbohydrate options is helpful too.    Type 2 Diabetes Mellitus, Diagnosis, Adult Type 2 diabetes (type 2 diabetes mellitus) is a long-term disease. It may happen when there is one or both of these problems:  The pancreas does not make enough  insulin.  The body does not react in a normal way to insulin that it makes. Insulin lets sugars go into cells in your body. If you have type 2 diabetes, sugars cannot get into your cells. Sugars build up in the blood. This causes high blood sugar. What are the causes? The exact cause of this condition is not known. What increases the risk? The following factors may make you more likely to develop this condition:  Having type 2 diabetes in your family.  Being overweight or very overweight.  Not being active.  Your body not reacting in a normal way to the insulin it makes.  Having higher than normal blood sugar over time.  Having a type of diabetes when you were pregnant.  Having a condition that causes small fluid-filled sacs on your ovaries. What are the signs or symptoms? At first, you may have no symptoms. You will get symptoms slowly. They may include:  More thirst than normal.  More hunger than normal.  Needing to pee more than normal.  Losing weight without trying.  Feeling tired.  Feeling weak.  Seeing things blurry.  Dark patches on your skin. How is this treated? This condition may be treated by a diabetes expert. You may need to:  Follow an eating plan made by a food expert (dietitian).  Get regular exercise.  Find ways to deal with stress.  Check blood sugar as often as told.  Take medicines. Your doctor will set treatment goals for you. Your blood sugar should be at these levels:  Before meals: 80-130 mg/dL (4.4-7.2 mmol/L).  After meals: below 180 mg/dL (10 mmol/L).  Over the last 2-3 months: less than 7%. Follow these instructions at home: Medicines  Take your diabetes medicines or insulin every day.  Take medicines to help you not get other problems caused by this condition. You may need: ? Aspirin. ? Medicine to lower cholesterol. ? Medicine to control blood pressure. Questions to ask your doctor  Should I meet with a diabetes  educator?  What medicines do I need, and when should I take them?  What will I need to treat my condition at home?  When should I check my blood sugar?  Where can I find a support group?  Who can I call if I have questions?  When is my next doctor visit? General instructions  Take over-the-counter and prescription medicines only as told by your doctor.  Keep all follow-up visits as told by your doctor. This is important. Where to find more information  American Diabetes Association (ADA): www.diabetes.org  American Association of Diabetes Care and Education Specialists (ADCES): www.diabeteseducator.org  International Diabetes Federation (IDF): MemberVerification.ca Contact a doctor if:  Your blood sugar is at or above 240 mg/dL (13.3 mmol/L) for 2 days in a row.  You have been sick for 2 days or more, and you are not getting better.  You have had a fever for 2 days or more, and you are not getting better.  You have any of these problems for more than 6 hours: ? You cannot eat or drink. ? You feel like you may vomit. ? You vomit. ? You have watery poop (diarrhea). Get help right away if:  Your blood sugar is very low. This means it is lower than 54 mg/dL (3 mmol/L).  You feel mixed up (confused).  You have trouble thinking clearly.  You have trouble breathing.  You have medium or large ketone levels in your pee. These symptoms may be an emergency. Do not wait to see if the symptoms will go away. Get medical help right away. Call your local emergency services (911 in the [place] Do not drive yourself to the hospital. Summary  Type 2 diabetes is a long-term disease. Your pancreas may not make enough insulin, or your body may not react in a normal way to insulin that it makes.  This condition is treated with an eating plan, lifestyle changes, and medicines.  Your doctor will set treatment goals for you. These will help you keep your blood sugar in a healthy range.  Keep all  follow-up visits as told by your doctor. This is important. This information is not intended to replace advice given to you by your health care provider. Make sure you discuss any questions you have with your health care provider. Document Revised: 02/05/2020 Document Reviewed: 02/05/2020 Elsevier Patient Education  2021 [place].  Metformin Tablets What is this medicine? METFORMIN (met FOR min) is used to treat type 2 diabetes. It helps to control blood sugar. Treatment is combined with diet and exercise. This medicine can be used alone or with other medicines for diabetes. This medicine may be used for other purposes; ask your health care provider or pharmacist if you have questions. COMMON BRAND NAME(S): Glucophage What should I tell my health care provider before I take this medicine? They need to know if you have any of these conditions:  anemia  dehydration  heart disease  frequently drink alcohol-containing beverages  kidney disease  liver disease  polycystic ovary syndrome  serious infection or injury  vomiting  an unusual or allergic reaction to metformin, other medicines, foods, dyes, or preservatives  pregnant or trying to get pregnant  breast-feeding How should I use this medicine? Take this medicine by mouth with a glass of water. Follow the directions on the prescription label. Take this medicine with food. Take your medicine at regular intervals. Do not take your medicine more often than directed. Do not stop taking except on your doctor's advice. Talk to your pediatrician regarding the use of this medicine in children. While this drug may be prescribed for children as young as 50 years of age for selected conditions, precautions do apply. Overdosage: If you think you have taken too much of this medicine contact a poison control center or emergency room at once. NOTE: This medicine is only for you. Do not share this medicine with others. What if I miss a  dose? If you miss a dose, take it as soon as you can. If it is almost time for your next dose, take only that dose. Do not take double or extra doses. What may interact with this medicine? Do not take this medicine with any of the following medications:  certain contrast medicines given before X-rays, CT scans, MRI, or other procedures  dofetilide This medicine may also interact with the following medications:  acetazolamide  alcohol  certain antivirals for HIV or hepatitis  certain medicines for  blood pressure, heart disease, irregular heart beat  cimetidine  dichlorphenamide  digoxin  diuretics  female hormones, like estrogens or progestins and birth control pills  glycopyrrolate  isoniazid  lamotrigine  memantine  methazolamide  metoclopramide  midodrine  niacin  phenothiazines like chlorpromazine, mesoridazine, prochlorperazine, thioridazine  phenytoin  ranolazine  steroid medicines like prednisone or cortisone  stimulant medicines for attention disorders, weight loss, or to stay awake  thyroid medicines  topiramate  trospium  vandetanib  zonisamide This list may not describe all possible interactions. Give your health care provider a list of all the medicines, herbs, non-prescription drugs, or dietary supplements you use. Also tell them if you smoke, drink alcohol, or use illegal drugs. Some items may interact with your medicine. What should I watch for while using this medicine? Visit your doctor or health care professional for regular checks on your progress. A test called the HbA1C (A1C) will be monitored. This is a simple blood test. It measures your blood sugar control over the last 2 to 3 months. You will receive this test every 3 to 6 months. Learn how to check your blood sugar. Learn the symptoms of low and high blood sugar and how to manage them. Always carry a quick-source of sugar with you in case you have symptoms of low blood  sugar. Examples include hard sugar candy or glucose tablets. Make sure others know that you can choke if you eat or drink when you develop serious symptoms of low blood sugar, such as seizures or unconsciousness. They must get medical help at once. Tell your doctor or health care professional if you have high blood sugar. You might need to change the dose of your medicine. If you are sick or exercising more than usual, you might need to change the dose of your medicine. Do not skip meals. Ask your doctor or health care professional if you should avoid alcohol. Many nonprescription cough and cold products contain sugar or alcohol. These can affect blood sugar. This medicine may cause ovulation in premenopausal women who do not have regular monthly periods. This may increase your chances of becoming pregnant. You should not take this medicine if you become pregnant or think you may be pregnant. Talk with your doctor or health care professional about your birth control options while taking this medicine. Contact your doctor or health care professional right away if you think you are pregnant. If you are going to need surgery, a MRI, CT scan, or other procedure, tell your doctor that you are taking this medicine. You may need to stop taking this medicine before the procedure. Wear a medical ID bracelet or chain, and carry a card that describes your disease and details of your medicine and dosage times. This medicine may cause a decrease in folic acid and vitamin B12. You should make sure that you get enough vitamins while you are taking this medicine. Discuss the foods you eat and the vitamins you take with your health care professional. What side effects may I notice from receiving this medicine? Side effects that you should report to your doctor or health care professional as soon as possible:  allergic reactions like skin rash, itching or hives, swelling of the face, lips, or tongue  breathing  problems  feeling faint or lightheaded, falls  muscle aches or pains  signs and symptoms of low blood sugar such as feeling anxious, confusion, dizziness, increased hunger, unusually weak or tired, sweating, shakiness, cold, irritable, headache, blurred vision, fast heartbeat,  loss of consciousness  slow or irregular heartbeat  unusual stomach pain or discomfort  unusually tired or weak Side effects that usually do not require medical attention (report to your doctor or health care professional if they continue or are bothersome):  diarrhea  headache  heartburn  metallic taste in mouth  nausea  stomach gas, upset This list may not describe all possible side effects. Call your doctor for medical advice about side effects. You may report side effects to FDA at 1-800-FDA-1088. Where should I keep my medicine? Keep out of the reach of children. Store at room temperature between 15 and 30 degrees C (59 and 86 degrees F). Protect from moisture and light. Throw away any unused medicine after the expiration date. NOTE: This sheet is a summary. It may not cover all possible information. If you have questions about this medicine, talk to your doctor, pharmacist, or health care provider.  2021 Elsevier/Gold Standard (2020-06-07 10:29:07)   High Cholesterol  High cholesterol is a condition in which the blood has high levels of a white, waxy substance similar to fat (cholesterol). The liver makes all the cholesterol that the body needs. The human body needs small amounts of cholesterol to help build cells. A person gets extra or excess cholesterol from the food that he or she eats. The blood carries cholesterol from the liver to the rest of the body. If you have high cholesterol, deposits (plaques) may build up on the walls of your arteries. Arteries are the blood vessels that carry blood away from your heart. These plaques make the arteries narrow and stiff. Cholesterol plaques increase  your risk for heart attack and stroke. Work with your health care provider to keep your cholesterol levels in a healthy range. What increases the risk? The following factors may make you more likely to develop this condition:  Eating foods that are high in animal fat (saturated fat) or cholesterol.  Being overweight.  Not getting enough exercise.  A family history of high cholesterol (familial hypercholesterolemia).  Use of tobacco products.  Having diabetes. What are the signs or symptoms? There are no symptoms of this condition. How is this diagnosed? This condition may be diagnosed based on the results of a blood test.  If you are older than 51 years of age, your health care provider may check your cholesterol levels every 4-6 years.  You may be checked more often if you have high cholesterol or other risk factors for heart disease. The blood test for cholesterol measures:  "Bad" cholesterol, or LDL cholesterol. This is the main type of cholesterol that causes heart disease. The desired level is less than 100 mg/dL.  "Good" cholesterol, or HDL cholesterol. HDL helps protect against heart disease by cleaning the arteries and carrying the LDL to the liver for processing. The desired level for HDL is 60 mg/dL or higher.  Triglycerides. These are fats that your body can store or burn for energy. The desired level is less than 150 mg/dL.  Total cholesterol. This measures the total amount of cholesterol in your blood and includes LDL, HDL, and triglycerides. The desired level is less than 200 mg/dL. How is this treated? This condition may be treated with:  Diet changes. You may be asked to eat foods that have more fiber and less saturated fats or added sugar.  Lifestyle changes. These may include regular exercise, maintaining a healthy weight, and quitting use of tobacco products.  Medicines. These are given when diet and lifestyle changes  have not worked. You may be prescribed a  statin medicine to help lower your cholesterol levels. Follow these instructions at home: Eating and drinking  Eat a healthy, balanced diet. This diet includes: ? Daily servings of a variety of fresh, frozen, or canned fruits and vegetables. ? Daily servings of whole grain foods that are rich in fiber. ? Foods that are low in saturated fats and trans fats. These include poultry and fish without skin, lean cuts of meat, and low-fat dairy products. ? A variety of fish, especially oily fish that contain omega-3 fatty acids. Aim to eat fish at least 2 times a week.  Avoid foods and drinks that have added sugar.  Use healthy cooking methods, such as roasting, grilling, broiling, baking, poaching, steaming, and stir-frying. Do not fry your food except for stir-frying.   Lifestyle  Get regular exercise. Aim to exercise for a total of 150 minutes a week. Increase your activity level by doing activities such as gardening, walking, and taking the stairs.  Do not use any products that contain nicotine or tobacco, such as cigarettes, e-cigarettes, and chewing tobacco. If you need help quitting, ask your health care provider.   General instructions  Take over-the-counter and prescription medicines only as told by your health care provider.  Keep all follow-up visits as told by your health care provider. This is important. Where to find more information  American Heart Association: www.heart.org  National Heart, Lung, and Blood Institute: https://wilson-eaton.com/ Contact a health care provider if:  You have trouble achieving or maintaining a healthy diet or weight.  You are starting an exercise program.  You are unable to stop smoking. Get help right away if:  You have chest pain.  You have trouble breathing.  You have any symptoms of a stroke. "BE FAST" is an easy way to remember the main warning signs of a stroke: ? B - Balance. Signs are dizziness, sudden trouble walking, or loss of  balance. ? E - Eyes. Signs are trouble seeing or a sudden change in vision. ? F - Face. Signs are sudden weakness or numbness of the face, or the face or eyelid drooping on one side. ? A - Arms. Signs are weakness or numbness in an arm. This happens suddenly and usually on one side of the body. ? S - Speech. Signs are sudden trouble speaking, slurred speech, or trouble understanding what people say. ? T - Time. Time to call emergency services. Write down what time symptoms started.  You have other signs of a stroke, such as: ? A sudden, severe headache with no known cause. ? Nausea or vomiting. ? Seizure. These symptoms may represent a serious problem that is an emergency. Do not wait to see if the symptoms will go away. Get medical help right away. Call your local emergency services (911 in the [place] Do not drive yourself to the hospital. Summary  Cholesterol plaques increase your risk for heart attack and stroke. Work with your health care provider to keep your cholesterol levels in a healthy range.  Eat a healthy, balanced diet, get regular exercise, and maintain a healthy weight.  Do not use any products that contain nicotine or tobacco, such as cigarettes, e-cigarettes, and chewing tobacco.  Get help right away if you have any symptoms of a stroke. This information is not intended to replace advice given to you by your health care provider. Make sure you discuss any questions you have with your health care provider. Document Revised:  06/10/2019 Document Reviewed: 06/10/2019 Elsevier Patient Education  2021 [place].  Rosuvastatin Capsules What is this medicine? ROSUVASTATIN (roe SOO va sta tin) is known as an HMG-CoA reductase inhibitor or 'statin'. It lowers cholesterol and triglycerides in the blood. Diet and lifestyle changes are often used with this drug. This medicine may be used for other purposes; ask your health care provider or pharmacist if you have questions. COMMON  BRAND NAME(S): Ezallor What should I tell my health care provider before I take this medicine? They need to know if you have any of these conditions:  diabetes  if you often drink alcohol  history of stroke  kidney disease  liver disease  muscle aches or weakness  thyroid disease  an unusual or allergic reaction to rosuvastatin, other medicines, foods, dyes, or preservatives  pregnant or trying to get pregnant  breast-feeding How should I use this medicine? Take this medicine by mouth with a glass of water. Follow the directions on the prescription label. Swallow the capsules whole. Do not crush or chew this medicine. You may open the capsule and put the contents in 1 teaspoon of applesauce, chocolate pudding, or vanilla pudding. Swallow the medicine with applesauce or pudding within 60 minutes (1 hour). Do not chew the medicine, applesauce, or pudding. You can take this medicine with or without food. Take your doses at regular intervals. Do not take your medicine more often than directed. Talk to your pediatrician about the use of this medicine in children. Special care may be needed. Overdosage: If you think you have taken too much of this medicine contact a poison control center or emergency room at once. NOTE: This medicine is only for you. Do not share this medicine with others. What if I miss a dose? If you miss a dose, take it as soon as you can. If your next dose is to be taken in less than 12 hours, then do not take the missed dose. Take the next dose at your regular time. Do not take double or extra doses. What may interact with this medicine? Do not take this medicine with any of the following medications:  herbal medicines like red yeast rice This medicine may also interact with the following medications:  alcohol  antacids containing aluminum hydroxide or magnesium hydroxide  cyclosporine  other medicines for high cholesterol  some medicines for HIV  infection  warfarin This list may not describe all possible interactions. Give your health care provider a list of all the medicines, herbs, non-prescription drugs, or dietary supplements you use. Also tell them if you smoke, drink alcohol, or use illegal drugs. Some items may interact with your medicine. What should I watch for while using this medicine? Visit your doctor or health care professional for regular check-ups. You may need regular tests to make sure your liver is working properly. Your health care professional may tell you to stop taking this medicine if you develop muscle problems. If your muscle problems do not go away after stopping this medicine, contact your health care professional. Do not become pregnant while taking this medicine. Women should inform their health care professional if they wish to become pregnant or think they might be pregnant. There is a potential for serious side effects to an unborn child. Talk to your health care professional or pharmacist for more information. Do not breast-feed an infant while taking this medicine. This medicine may increase blood sugar. Ask your healthcare provider if changes in diet or medicines are needed  if you have diabetes. If you are going to need surgery or other procedure, tell your doctor that you are using this medicine. This drug is only part of a total heart-health program. Your doctor or a dietician can suggest a low-cholesterol and low-fat diet to help. Avoid alcohol and smoking, and keep a proper exercise schedule. This medicine may cause a decrease in Co-Enzyme Q-10. You should make sure that you get enough Co-Enzyme Q-10 while you are taking this medicine. Discuss the foods you eat and the vitamins you take with your health care professional. What side effects may I notice from receiving this medicine? Side effects that you should report to your doctor or health care professional as soon as possible:  allergic reactions  like skin rash, itching or hives, swelling of the face, lips, or tongue  dark urine  fever  joint pain  muscle cramps, pain  redness, blistering, peeling or loosening of the skin, including inside the mouth  signs and symptoms of high blood sugar such as being more thirsty or hungry or having to urinate more than normal. You may also feel very tired or have blurry vision.  trouble passing urine or change in the amount of urine  unusually weak  yellowing of the eyes or skin Side effects that usually do not require medical attention (report these to your doctor or health care professional if they continue or are bothersome):  constipation  heartburn  nausea  stomach gas, pain, upset This list may not describe all possible side effects. Call your doctor for medical advice about side effects. You may report side effects to FDA at 1-800-FDA-1088. Where should I keep my medicine? Keep out of the reach of children. Store at room temperature between 20 and 25 degrees C (68 and 77 degrees F). Keep container tightly closed (protect from moisture). Throw away any unused medicine after the expiration date. NOTE: This sheet is a summary. It may not cover all possible information. If you have questions about this medicine, talk to your doctor, pharmacist, or health care provider.  2021 Elsevier/Gold Standard (2020-05-24 09:54:10)

## 2020-12-22 NOTE — Assessment & Plan Note (Signed)
New diagnosis of type 2 DM and HLD based on recent labs Significant amount of time spent today with education on illness and recommendations for diet, exercise, and medication.  Brief discussion on recommended vaccines and screenings for diabetic patients. Plan to discuss further at follow-up appt Start metformin with taper to 1500mg  per day in divided doses. Start rosuvastatin 20mg  per day Simple foot exam today negative.  Will recheck labs in 3 months

## 2020-12-22 NOTE — Assessment & Plan Note (Signed)
Long standing hx of Vitamin D deficiency. Has been on supplements in the past but does not absorb well due to gastric bypass history Will trial 8 weeks of 50000iU per week dosing to see if there is any improvement of symptoms.  F/U in 3 months.

## 2021-01-14 ENCOUNTER — Other Ambulatory Visit: Payer: Self-pay

## 2021-01-14 ENCOUNTER — Ambulatory Visit (HOSPITAL_BASED_OUTPATIENT_CLINIC_OR_DEPARTMENT_OTHER)
Admission: RE | Admit: 2021-01-14 | Discharge: 2021-01-14 | Disposition: A | Discharge status: Home / Self Care | Payer: BC Managed Care – PPO | Source: Ambulatory Visit | Attending: Nurse Practitioner | Admitting: Nurse Practitioner

## 2021-01-14 DIAGNOSIS — Z1231 Encounter for screening mammogram for malignant neoplasm of breast: Secondary | ICD-10-CM | POA: Insufficient documentation

## 2021-01-18 ENCOUNTER — Encounter (HOSPITAL_BASED_OUTPATIENT_CLINIC_OR_DEPARTMENT_OTHER): Payer: Self-pay | Admitting: Nurse Practitioner

## 2021-01-18 ENCOUNTER — Other Ambulatory Visit (HOSPITAL_BASED_OUTPATIENT_CLINIC_OR_DEPARTMENT_OTHER): Payer: Self-pay | Admitting: Nurse Practitioner

## 2021-01-18 DIAGNOSIS — B379 Candidiasis, unspecified: Secondary | ICD-10-CM

## 2021-01-18 MED ORDER — OMEPRAZOLE 40 MG PO CPDR: 40.0000 mg | DELAYED_RELEASE_CAPSULE | Freq: Every day | ORAL | 1 refills | Status: DC

## 2021-01-19 MED ORDER — NYSTATIN 100000 UNIT/GM EX POWD: 1.0000 "application " | Freq: Three times a day (TID) | CUTANEOUS | 0 refills | Status: DC

## 2021-01-21 ENCOUNTER — Encounter: Payer: BC Managed Care – PPO | Admitting: Podiatry

## 2021-01-28 ENCOUNTER — Encounter: Payer: Self-pay | Admitting: Podiatry

## 2021-01-28 ENCOUNTER — Encounter (HOSPITAL_BASED_OUTPATIENT_CLINIC_OR_DEPARTMENT_OTHER): Payer: Self-pay | Admitting: Nurse Practitioner

## 2021-01-28 DIAGNOSIS — Z1231 Encounter for screening mammogram for malignant neoplasm of breast: Secondary | ICD-10-CM

## 2021-01-28 NOTE — Progress Notes (Signed)
No suspicious findings.. Repeat in one year.

## 2021-02-01 ENCOUNTER — Encounter (HOSPITAL_BASED_OUTPATIENT_CLINIC_OR_DEPARTMENT_OTHER): Payer: Self-pay | Admitting: Nurse Practitioner

## 2021-02-01 ENCOUNTER — Telehealth: Payer: Self-pay | Admitting: Urology

## 2021-02-01 NOTE — Telephone Encounter (Signed)
DOS - 02/05/21  REMOVAL FIXATION DEEP RIGHT --- 20680  [employer] EFFECTIVE DATE- 12/19/19   SPOKE WITH OSCAR WITH [facility] AND HE STATED THAT CPT CODE 41660 NO AUTH IS REQUIRED.  REF # [id number]

## 2021-02-02 ENCOUNTER — Other Ambulatory Visit (HOSPITAL_BASED_OUTPATIENT_CLINIC_OR_DEPARTMENT_OTHER): Payer: Self-pay | Admitting: Nurse Practitioner

## 2021-02-02 DIAGNOSIS — E65 Localized adiposity: Secondary | ICD-10-CM

## 2021-02-03 ENCOUNTER — Encounter: Payer: Self-pay | Admitting: Podiatry

## 2021-02-03 ENCOUNTER — Telehealth: Payer: Self-pay | Admitting: Urology

## 2021-02-03 NOTE — Telephone Encounter (Signed)
PT Is calling wanting to know how long she needs to expect to be out of work. Her new job is wanting to know if she can start on Monday but she believes that may be too soon. She says it is a standing/ sitting job. Please Advise.

## 2021-02-03 NOTE — Telephone Encounter (Signed)
I'm OK with that if she can be seated and keep the foot elevated. Small amounts of walking/standing are OK, but no more than 5-10 minutes at a time. She'll have a dressing on until her first post op that has to stay on and dry

## 2021-02-04 ENCOUNTER — Encounter: Payer: BC Managed Care – PPO | Admitting: Podiatry

## 2021-02-05 DIAGNOSIS — Z4889 Encounter for other specified surgical aftercare: Secondary | ICD-10-CM | POA: Diagnosis not present

## 2021-02-05 DIAGNOSIS — T8484XA Pain due to internal orthopedic prosthetic devices, implants and grafts, initial encounter: Secondary | ICD-10-CM | POA: Diagnosis not present

## 2021-02-11 ENCOUNTER — Ambulatory Visit (INDEPENDENT_AMBULATORY_CARE_PROVIDER_SITE_OTHER): Payer: BC Managed Care – PPO

## 2021-02-11 ENCOUNTER — Ambulatory Visit (INDEPENDENT_AMBULATORY_CARE_PROVIDER_SITE_OTHER): Payer: BC Managed Care – PPO | Admitting: Podiatry

## 2021-02-11 ENCOUNTER — Other Ambulatory Visit: Payer: Self-pay

## 2021-02-11 DIAGNOSIS — M2011 Hallux valgus (acquired), right foot: Secondary | ICD-10-CM | POA: Diagnosis not present

## 2021-02-11 DIAGNOSIS — M21611 Bunion of right foot: Secondary | ICD-10-CM

## 2021-02-11 DIAGNOSIS — T8484XA Pain due to internal orthopedic prosthetic devices, implants and grafts, initial encounter: Secondary | ICD-10-CM

## 2021-02-11 DIAGNOSIS — Z9889 Other specified postprocedural states: Secondary | ICD-10-CM

## 2021-02-14 ENCOUNTER — Other Ambulatory Visit (HOSPITAL_BASED_OUTPATIENT_CLINIC_OR_DEPARTMENT_OTHER): Payer: Self-pay | Admitting: Nurse Practitioner

## 2021-02-14 DIAGNOSIS — E559 Vitamin D deficiency, unspecified: Secondary | ICD-10-CM

## 2021-02-15 ENCOUNTER — Other Ambulatory Visit (HOSPITAL_BASED_OUTPATIENT_CLINIC_OR_DEPARTMENT_OTHER): Payer: Self-pay | Admitting: Nurse Practitioner

## 2021-02-15 NOTE — Progress Notes (Signed)
  Subjective:  Patient ID: Cassidy Leach, female    DOB: November 20, 1970,  MRN: [id number]  Chief Complaint  Patient presents with   Routine Post Op      (xray)POV #1 DOS 02/05/2021 SCREW REMOVAL RT FOOT    DOS: 02/05/2021 Procedure: Right foot screw removal  50 y.o. female returns for post-op check.  She is doing well having minimal pain  Review of Systems: Negative except as noted in the HPI. Denies N/V/F/Ch.   Objective:  There were no vitals filed for this visit. There is no height or weight on file to calculate BMI. Constitutional Well developed. Well nourished.  Vascular Foot warm and well perfused. Capillary refill normal to all digits.   Neurologic Normal speech. Oriented to person, place, and time. Epicritic sensation to light touch grossly present bilaterally.  Dermatologic Skin healing well without signs of infection. Skin edges well coapted without signs of infection.  Orthopedic: Tenderness to palpation noted about the surgical site.   Multiple view plain film radiographs: Status post removal Assessment:   1. Hallux valgus with bunions, right    Plan:  Patient was evaluated and treated and all questions answered.  S/p foot surgery right -Progressing as expected post-operatively. -XR: As above -WB Status: WBAT in regular shoes -Sutures: Remove in 1 week. -Medications: No refills required -Foot redressed.  Return in about 1 week (around 02/18/2021) for suture removal.

## 2021-02-18 ENCOUNTER — Other Ambulatory Visit (HOSPITAL_BASED_OUTPATIENT_CLINIC_OR_DEPARTMENT_OTHER): Payer: Self-pay | Admitting: Nurse Practitioner

## 2021-02-18 DIAGNOSIS — E559 Vitamin D deficiency, unspecified: Secondary | ICD-10-CM

## 2021-02-19 ENCOUNTER — Encounter (HOSPITAL_BASED_OUTPATIENT_CLINIC_OR_DEPARTMENT_OTHER): Payer: Self-pay | Admitting: Nurse Practitioner

## 2021-02-19 NOTE — Telephone Encounter (Signed)
Per [name], DNP on 05/31 patient was started on 8 week trial of vit d supplementation. Patient was instructed to come back in 8 weeks and have vit d levels checked in order to decide if supplementation is still necessary Patient needs follow up

## 2021-02-25 ENCOUNTER — Ambulatory Visit (INDEPENDENT_AMBULATORY_CARE_PROVIDER_SITE_OTHER): Payer: BC Managed Care – PPO | Admitting: Podiatry

## 2021-02-25 ENCOUNTER — Encounter: Payer: BC Managed Care – PPO | Admitting: Podiatry

## 2021-02-25 ENCOUNTER — Other Ambulatory Visit: Payer: Self-pay

## 2021-02-25 DIAGNOSIS — E1165 Type 2 diabetes mellitus with hyperglycemia: Secondary | ICD-10-CM

## 2021-02-25 DIAGNOSIS — M79675 Pain in left toe(s): Secondary | ICD-10-CM | POA: Diagnosis not present

## 2021-02-25 DIAGNOSIS — T8484XA Pain due to internal orthopedic prosthetic devices, implants and grafts, initial encounter: Secondary | ICD-10-CM

## 2021-02-25 DIAGNOSIS — B351 Tinea unguium: Secondary | ICD-10-CM

## 2021-02-25 DIAGNOSIS — M21611 Bunion of right foot: Secondary | ICD-10-CM

## 2021-02-25 DIAGNOSIS — M79674 Pain in right toe(s): Secondary | ICD-10-CM

## 2021-02-25 DIAGNOSIS — M2011 Hallux valgus (acquired), right foot: Secondary | ICD-10-CM

## 2021-02-25 DIAGNOSIS — Z9889 Other specified postprocedural states: Secondary | ICD-10-CM

## 2021-03-01 ENCOUNTER — Encounter: Payer: Self-pay | Admitting: Podiatry

## 2021-03-01 NOTE — Progress Notes (Signed)
  Subjective:  Patient ID: Cassidy Leach, female    DOB: April 13, 1971,  MRN: [id number]  Chief Complaint  Patient presents with   Routine Post Op      POV #2 DOS 02/05/2021 SCREW REMOVAL RT FOOT    DOS: 02/05/2021 Procedure: Right foot screw removal  50 y.o. female returns for post-op check.  She is doing well she was recently diagnosed with diabetes, her nails are thickened elongated and causing pain  Review of Systems: Negative except as noted in the HPI. Denies N/V/F/Ch.   Objective:  There were no vitals filed for this visit. There is no height or weight on file to calculate BMI. Constitutional Well developed. Well nourished.  Vascular Foot warm and well perfused. Capillary refill normal to all digits.   Neurologic Normal speech. Oriented to person, place, and time. Epicritic sensation to light touch grossly present bilaterally.  Dermatologic Skin healing well without signs of infection. Skin edges well coapted without signs of infection.  She has thickened elongated toenails with subungual debris and discoloration  Orthopedic: Tenderness to palpation noted about the surgical site.   Multiple view plain film radiographs: Status post removal Assessment:   1. Hallux valgus with bunions, right   2. Pain due to internal orthopedic prosthetic devices, implants and grafts, initial encounter (HCC)   3. Post-operative state   4. Pain due to onychomycosis of toenails of both feet   5. Type 2 diabetes mellitus with hyperglycemia, without long-term current use of insulin (HCC)    Plan:  Patient was evaluated and treated and all questions answered.  S/p foot surgery right -Overall doing well suture removed and she can resume regular shoe gear and activity.  Discussed the etiology and treatment options for the condition in detail with the patient. Educated patient on the topical and oral treatment options for mycotic nails. Recommended debridement of the nails today. Sharp and  mechanical debridement performed of all painful and mycotic nails today. Nails debrided in length and thickness using a nail nipper to level of comfort. Discussed treatment options including appropriate shoe gear. Follow up as needed for painful nails.    No follow-ups on file.

## 2021-03-03 ENCOUNTER — Encounter (HOSPITAL_BASED_OUTPATIENT_CLINIC_OR_DEPARTMENT_OTHER): Payer: Self-pay | Admitting: Nurse Practitioner

## 2021-03-03 DIAGNOSIS — U071 COVID-19: Secondary | ICD-10-CM

## 2021-03-05 ENCOUNTER — Telehealth (HOSPITAL_BASED_OUTPATIENT_CLINIC_OR_DEPARTMENT_OTHER): Payer: Self-pay

## 2021-03-05 MED ORDER — NIRMATRELVIR/RITONAVIR (PAXLOVID)TABLET: 3.0000 | ORAL_TABLET | Freq: Two times a day (BID) | ORAL | 0 refills | Status: AC

## 2021-03-05 NOTE — Telephone Encounter (Signed)
In response to MyChart message patient sent.  Patient tested positive for Covid yesterday.  Per [name], AGNP patient is in agreement to Paxlovid and other recommendations medication will be prescribed.  Medication can be sent in to [facility] at the corner of [address] and [address].

## 2021-03-18 ENCOUNTER — Encounter: Payer: BC Managed Care – PPO | Admitting: Podiatry

## 2021-03-25 ENCOUNTER — Encounter: Payer: Self-pay | Admitting: Podiatry

## 2021-04-15 ENCOUNTER — Telehealth: Payer: Self-pay | Admitting: *Deleted

## 2021-04-15 NOTE — Telephone Encounter (Signed)
Patient is calling with concerns about a burning sensation on bottom of foot, no redness or bruising,great toe is continuing to go numb  since surgery.  Returned call to patient and gave Dr Vara Guardian recommendations that this should resolve over a period of time but she may schedule a f/u appointment in the next 2-3 weeks if it doesn't.

## 2021-04-16 ENCOUNTER — Telehealth: Payer: Self-pay | Admitting: *Deleted

## 2021-04-21 ENCOUNTER — Ambulatory Visit (INDEPENDENT_AMBULATORY_CARE_PROVIDER_SITE_OTHER): Payer: BC Managed Care – PPO | Admitting: Podiatry

## 2021-04-21 ENCOUNTER — Other Ambulatory Visit: Payer: Self-pay

## 2021-04-21 DIAGNOSIS — G629 Polyneuropathy, unspecified: Secondary | ICD-10-CM

## 2021-04-21 MED ORDER — GABAPENTIN 300 MG PO CAPS
ORAL_CAPSULE | ORAL | 2 refills | Status: DC
Start: 2021-04-21 — End: 2021-08-05

## 2021-04-21 NOTE — Progress Notes (Signed)
  Subjective:  Patient ID: Cassidy Leach, female    DOB: 1970-11-05,  MRN: [id number]  Chief Complaint  Patient presents with   Bunions     POV #3 DOS 02/05/2021 SCREW REMOVAL RT FOOT    DOS: 02/05/2021 Procedure: Right foot screw removal  50 y.o. female returns for post-op check.  Having some numbness along the big toe and pain in the forefoot on the ball of the foot  Review of Systems: Negative except as noted in the HPI. Denies N/V/F/Ch.   Objective:  There were no vitals filed for this visit. There is no height or weight on file to calculate BMI. Constitutional Well developed. Well nourished.  Vascular Foot warm and well perfused. Capillary refill normal to all digits.   Neurologic Normal speech. Oriented to person, place, and time. Epicritic sensation to light touch grossly present bilaterally.  Dermatologic Surgical incision is well-healed there is some thickening of the scar around the screw removal site  Orthopedic: Tenderness to palpation noted about the surgical site.   Multiple view plain film radiographs: Status post removal Assessment:   1. Peripheral neuritis    Plan:  Patient was evaluated and treated and all questions answered.  S/p foot surgery right -Suspect this is likely neuritis secondary to scar tissue from her surgical site.  I think she can continue her regular activities.  Hopefully this will resolve with time the further we get from surgery.  I recommend we treat with gabapentin for symptomatic control for now 300 mg nightly for 2 to 3 weeks and then taper to a twice daily dosing.  She will follow-up with me in November.  Consider corticosteroid injection along the digital nerve if not improving.    Return in about 8 weeks (around 06/16/2021).

## 2021-04-22 ENCOUNTER — Encounter: Payer: Self-pay | Admitting: Plastic Surgery

## 2021-04-22 ENCOUNTER — Other Ambulatory Visit: Payer: Self-pay

## 2021-04-22 ENCOUNTER — Ambulatory Visit (INDEPENDENT_AMBULATORY_CARE_PROVIDER_SITE_OTHER): Payer: BC Managed Care – PPO | Admitting: Plastic Surgery

## 2021-04-22 VITALS — BP 149/89 | HR 79 | Ht 67.0 in | Wt 231.4 lb

## 2021-04-22 DIAGNOSIS — M793 Panniculitis, unspecified: Secondary | ICD-10-CM

## 2021-04-22 NOTE — Progress Notes (Signed)
Referring Provider Early, Coralee Pesa, NP [address],  [place] [id number]   CC:  Chief Complaint  Patient presents with   Advice Only      Cassidy Leach is an 50 y.o. female.  HPI: Patient presents to discuss panniculectomy.  She had gastric sleeve years ago.  She had a lap band before that but it was converted to a sleeve.  She lost 50 to 60 pounds and her weight has been stable for the past year or so.  She is bothered by the overhanging abdominal skin.  She gets rashes in the crease beneath that been refractory to over-the-counter prescription treatments.  She is interested in surgical correction of this problem.  Not on File  Outpatient Encounter Medications as of 04/22/2021  Medication Sig   azelastine (ASTELIN) 0.1 % nasal spray Place 2 sprays into both nostrils 2 (two) times daily. Use in each nostril as directed   azelastine (OPTIVAR) 0.05 % ophthalmic solution Place 1 drop into both eyes 2 (two) times daily.   benzonatate (TESSALON) 100 MG capsule Take 100 mg by mouth 3 (three) times daily.   blood glucose meter kit and supplies Monitor blood sugar every morning before eating or drinking and write on log. May monitor at additional times during the day if having symptoms.   cetirizine (ZYRTEC) 10 MG tablet Take 1 tablet by mouth daily.   erythromycin ophthalmic ointment Place 1 application into both eyes at bedtime.   fluticasone (FLONASE) 50 MCG/ACT nasal spray Place into both nostrils.   gabapentin (NEURONTIN) 300 MG capsule Take 1 capsule (300 mg total) by mouth at bedtime for 21 days, THEN 1 capsule (300 mg total) 2 (two) times daily for 21 days.   lidocaine (XYLOCAINE) 5 % ointment Apply 1 application topically as needed.   metFORMIN (GLUCOPHAGE) 500 MG tablet Start with 555m (1 tab) with dinner for 7 days, then increase to 5079m(1 tab) with breakfast and 50056m1 tab) with dinner for 7 days, then increase to 500m8m tab) with breakfast and 1000mg6m tabs) with dinner daily.   nystatin powder Apply 1 application topically 3 (three) times daily.   omeprazole (PRILOSEC) 40 MG capsule TAKE 1 CAPSULE(40 MG) BY MOUTH DAILY   Polyethyl Glycol-Propyl Glycol (SYSTANE) 0.4-0.3 % SOLN 1-2 drops per eye twice daily as needed   rosuvastatin (CRESTOR) 20 MG tablet Take 1 tablet (20 mg total) by mouth daily.   levocetirizine (XYZAL) 5 MG tablet Take 1 tablet (5 mg total) by mouth every evening.   [DISCONTINUED] Vitamin D, Ergocalciferol, (DRISDOL) 1.25 MG (50000 UNIT) CAPS capsule Take 1 capsule (50,000 Units total) by mouth every 7 (seven) days. Take for 8 total doses(weeks)   No facility-administered encounter medications on file as of 04/22/2021.     Past Medical History:  Diagnosis Date   Allergy    Seasonal   Asthma    Encounter to establish care 11/10/2020   Hyperlipidemia    Impaired fasting glucose 05/17/2017   Myalgia and myositis     Past Surgical History:  Procedure Laterality Date   ABDOMINAL HYSTERECTOMY     TUBAL LIGATION      No family history on file.  Social History   Social History Narrative   Not on file  Denies tobacco use  Review of Systems General: Denies fevers, chills, weight loss CV: Denies chest pain, shortness of breath, palpitations  Physical Exam Vitals with BMI 04/22/2021 12/22/2020 12/10/2020  Height 5'  7" _0  _1   Weight 231 lbs 6 oz 235 lbs 233 lbs 10 oz  BMI 36.23 17.2 09.10  Systolic 681 661 969  Diastolic 89 76 70  Pulse 79 100 102    General:  No acute distress,  Alert and oriented, Non-Toxic, Normal speech and affect Abdomen: Abdomen is soft nontender.  Scattered scars from her laparoscopic procedures.  She has a lower transverse scar which she says is from her hysterectomy.  No obvious hernias.  She does have overhanging pannus.  She has moderate excess adipose tissue in the infra and supraumbilical areas.  She does seem to have moderate intra-abdominal fullness as  well.  Assessment/Plan Patient is reasonable candidate for infraumbilical panniculectomy.  I explained this would not address the upper abdomen and she would rather not pursue any treatment of that at this time.  She be content to remove the extra skin below the umbilicus and address the functional concerns that she has in that area.  We discussed the risks and benefits of the procedure in detail.  We discussed the risk that include bleeding, infection, damage to surrounding structures and need for additional procedures.  I discussed the location and orientation of the scar.  I explained the general postoperative expectations including the recovery period of 4 to [redacted] weeks along with the need for drains postoperatively.  She is fully understanding and interested in moving forward.  Cindra Presume 04/22/2021, 1:07 PM

## 2021-04-28 ENCOUNTER — Other Ambulatory Visit (HOSPITAL_BASED_OUTPATIENT_CLINIC_OR_DEPARTMENT_OTHER): Payer: Self-pay | Admitting: Nurse Practitioner

## 2021-04-28 DIAGNOSIS — E1165 Type 2 diabetes mellitus with hyperglycemia: Secondary | ICD-10-CM

## 2021-04-28 DIAGNOSIS — E1169 Type 2 diabetes mellitus with other specified complication: Secondary | ICD-10-CM

## 2021-04-29 ENCOUNTER — Other Ambulatory Visit (HOSPITAL_BASED_OUTPATIENT_CLINIC_OR_DEPARTMENT_OTHER): Payer: Self-pay | Admitting: Nurse Practitioner

## 2021-04-29 DIAGNOSIS — B379 Candidiasis, unspecified: Secondary | ICD-10-CM

## 2021-04-29 MED ORDER — NYSTATIN 100000 UNIT/GM EX POWD: 1.0000 "application " | Freq: Three times a day (TID) | CUTANEOUS | 0 refills | Status: DC

## 2021-04-30 ENCOUNTER — Ambulatory Visit (INDEPENDENT_AMBULATORY_CARE_PROVIDER_SITE_OTHER): Payer: BC Managed Care – PPO | Admitting: Nurse Practitioner

## 2021-04-30 ENCOUNTER — Other Ambulatory Visit (HOSPITAL_BASED_OUTPATIENT_CLINIC_OR_DEPARTMENT_OTHER): Payer: Self-pay | Admitting: Nurse Practitioner

## 2021-04-30 ENCOUNTER — Other Ambulatory Visit: Payer: Self-pay

## 2021-04-30 DIAGNOSIS — Z23 Encounter for immunization: Secondary | ICD-10-CM | POA: Diagnosis not present

## 2021-04-30 DIAGNOSIS — E1169 Type 2 diabetes mellitus with other specified complication: Secondary | ICD-10-CM

## 2021-04-30 DIAGNOSIS — E785 Hyperlipidemia, unspecified: Secondary | ICD-10-CM | POA: Diagnosis not present

## 2021-04-30 DIAGNOSIS — E559 Vitamin D deficiency, unspecified: Secondary | ICD-10-CM | POA: Diagnosis not present

## 2021-04-30 DIAGNOSIS — E1165 Type 2 diabetes mellitus with hyperglycemia: Secondary | ICD-10-CM | POA: Diagnosis not present

## 2021-05-01 LAB — COMPREHENSIVE METABOLIC PANEL
ALT: 13 IU/L (ref 0–32)
AST: 16 IU/L (ref 0–40)
Albumin/Globulin Ratio: 1.8 (ref 1.2–2.2)
Albumin: 4.9 g/dL (ref 3.8–4.9)
Alkaline Phosphatase: 55 IU/L (ref 44–121)
BUN/Creatinine Ratio: 13 (ref 9–23)
BUN: 10 mg/dL (ref 6–24)
Bilirubin Total: 0.4 mg/dL (ref 0.0–1.2)
CO2: 21 mmol/L (ref 20–29)
Calcium: 9.7 mg/dL (ref 8.7–10.2)
Chloride: 98 mmol/L (ref 96–106)
Creatinine, Ser: 0.75 mg/dL (ref 0.57–1.00)
Globulin, Total: 2.8 g/dL (ref 1.5–4.5)
Glucose: 84 mg/dL (ref 70–99)
Potassium: 4.1 mmol/L (ref 3.5–5.2)
Sodium: 138 mmol/L (ref 134–144)
Total Protein: 7.7 g/dL (ref 6.0–8.5)
eGFR: 96 mL/min/{1.73_m2} (ref >59)

## 2021-05-01 LAB — LIPID PANEL
Chol/HDL Ratio: 6 ratio — ABNORMAL HIGH (ref 0.0–4.4)
Cholesterol, Total: 217 mg/dL — ABNORMAL HIGH (ref 100–199)
HDL: 36 mg/dL — ABNORMAL LOW (ref >39)
LDL Chol Calc (NIH): 159 mg/dL — ABNORMAL HIGH (ref 0–99)
Triglycerides: 118 mg/dL (ref 0–149)
VLDL Cholesterol Cal: 22 mg/dL (ref 5–40)

## 2021-05-01 LAB — HEMOGLOBIN A1C
Est. average glucose Bld gHb Est-mCnc: 134 mg/dL
Hgb A1c MFr Bld: 6.3 % — ABNORMAL HIGH (ref 4.8–5.6)

## 2021-05-01 LAB — VITAMIN D 25 HYDROXY (VIT D DEFICIENCY, FRACTURES): Vit D, 25-Hydroxy: 21.1 ng/mL — ABNORMAL LOW (ref 30.0–100.0)

## 2021-05-03 ENCOUNTER — Encounter (HOSPITAL_BASED_OUTPATIENT_CLINIC_OR_DEPARTMENT_OTHER): Payer: Self-pay | Admitting: Nurse Practitioner

## 2021-05-03 DIAGNOSIS — E1169 Type 2 diabetes mellitus with other specified complication: Secondary | ICD-10-CM

## 2021-05-03 DIAGNOSIS — E785 Hyperlipidemia, unspecified: Secondary | ICD-10-CM

## 2021-05-03 DIAGNOSIS — E1165 Type 2 diabetes mellitus with hyperglycemia: Secondary | ICD-10-CM

## 2021-05-04 ENCOUNTER — Encounter: Payer: Self-pay | Admitting: Plastic Surgery

## 2021-05-05 ENCOUNTER — Telehealth (HOSPITAL_BASED_OUTPATIENT_CLINIC_OR_DEPARTMENT_OTHER): Payer: Self-pay

## 2021-05-05 DIAGNOSIS — E1165 Type 2 diabetes mellitus with hyperglycemia: Secondary | ICD-10-CM

## 2021-05-05 DIAGNOSIS — E1169 Type 2 diabetes mellitus with other specified complication: Secondary | ICD-10-CM

## 2021-05-05 MED ORDER — METFORMIN HCL 500 MG PO TABS: ORAL_TABLET | ORAL | 0 refills | Status: DC

## 2021-05-05 NOTE — Telephone Encounter (Signed)
-----   Message from [name], NP sent at 05/03/2021  8:11 AM EDT ----- Aram Beecham,   Your metabolic panel looks good. No kidney, liver, or electrolyte dysfunction present.  Your cholesterol is quite elevated with very low levels of "good" cholesterol.  Your vitamin D levels are still low when compared to 4 months ago.  Your hemoglobin A1c shows better blood sugar control at 6.3% with an average blood sugar of 134.  Are you taking the rosuvastatin (Crestor) daily? Are you taking 1000mg  of metformin twice a day?  SaraBeth

## 2021-05-05 NOTE — Telephone Encounter (Signed)
Called patient to discuss lab results. Patient is aware and agreeable to labs and recommendations. Patient stated she would discuss how she takes her medications at her upcoming appointment.  Instructed her to contact the office with questions or concerns.

## 2021-05-06 MED ORDER — METFORMIN HCL 500 MG PO TABS: ORAL_TABLET | ORAL | 0 refills | Status: DC

## 2021-05-06 NOTE — Addendum Note (Signed)
Addended by: Agapito Games D on: 05/06/2021 01:28 PM   Modules accepted: Orders

## 2021-05-10 ENCOUNTER — Encounter (HOSPITAL_BASED_OUTPATIENT_CLINIC_OR_DEPARTMENT_OTHER): Payer: Self-pay | Admitting: Nurse Practitioner

## 2021-05-10 ENCOUNTER — Ambulatory Visit (INDEPENDENT_AMBULATORY_CARE_PROVIDER_SITE_OTHER): Payer: BC Managed Care – PPO | Admitting: Nurse Practitioner

## 2021-05-10 ENCOUNTER — Other Ambulatory Visit: Payer: Self-pay

## 2021-05-10 VITALS — Ht 67.0 in | Wt 231.0 lb

## 2021-05-10 DIAGNOSIS — E785 Hyperlipidemia, unspecified: Secondary | ICD-10-CM | POA: Diagnosis not present

## 2021-05-10 DIAGNOSIS — E1169 Type 2 diabetes mellitus with other specified complication: Secondary | ICD-10-CM | POA: Diagnosis not present

## 2021-05-10 DIAGNOSIS — E1165 Type 2 diabetes mellitus with hyperglycemia: Secondary | ICD-10-CM

## 2021-05-10 DIAGNOSIS — E559 Vitamin D deficiency, unspecified: Secondary | ICD-10-CM

## 2021-05-10 MED ORDER — ROSUVASTATIN CALCIUM 20 MG PO TABS: 20.0000 mg | ORAL_TABLET | Freq: Every day | ORAL | 3 refills | Status: DC

## 2021-05-10 MED ORDER — VITAMIN D (ERGOCALCIFEROL) 1.25 MG (50000 UNIT) PO CAPS
ORAL_CAPSULE | ORAL | 0 refills | Status: DC
Start: 2021-05-10 — End: 2021-09-14

## 2021-05-10 MED ORDER — METFORMIN HCL 500 MG PO TABS: 500.0000 mg | ORAL_TABLET | Freq: Every day | ORAL | 3 refills | Status: DC

## 2021-05-10 NOTE — Assessment & Plan Note (Signed)
Hemoglobin A1c has improved from last check. Unfortunately she is unable to tolerate larger doses of metformin at this time therefore we will remain on 500 mg of metformin once a day Consider GLP-1 if at next check A1c is not where we would like it to be. Recommend continue monitoring diet and increasing activity with a goal of a minimum of 20 minutes/day of purposeful walking. Recommend that she does restart Crestor at a minimum every other day to help reduce cardiovascular risks associated with diabetes. We will plan to follow-up in 6 months with labs.

## 2021-05-10 NOTE — Progress Notes (Signed)
Virtual Visit Encounter  telephone visit.   I connected with  Cassidy Leach on 05/10/21 at  3:10 PM EDT by secure audio and/or video enabled telemedicine application. I verified that I am speaking with the correct person using two identifiers.   I introduced myself as a Publishing rights manager with the practice. The limitations of evaluation and management by telemedicine discussed with the patient and the availability of in person appointments. The patient expressed verbal understanding and consent to proceed.  Participating parties in this visit include: Myself and patient  The patient is: Patient Location: Home I am: Provider Location: Office/Clinic Subjective:    CC and HPI: Cassidy Leach is a 50 y.o. year old female presenting for follow up of labs.  Recent labs as follows. A1c: 6.3% (down from 6.7%) CMP: Normal Lipids: LDL 159, HDL 36 Vitamin D: 21.1 (up from 14.56) She endorses a longstanding history of vitamin D deficiency with difficulty to replace due to gastric bypass surgery. She has been on vitamin D replacement multiple times with very limited efficacy. This does concern her.  She endorses since her last visit she has been watching what she eats and exercising more frequently. She works with Geophysicist/field seismologist on the weekends and has been walking a lot  She has not been taking her rosuvastatin on a daily basis as she did read possibility of increased cancer risks when taken with metformin.  She is not sure exactly where she read this but wanted to discuss this with me. She has been taking metformin 500 mg once a day as she has been able to tolerate the larger doses.  Past medical history, Surgical history, Family history not pertinant except as noted below, Social history, Allergies, and medications have been entered into the medical record, reviewed, and corrections made.   Review of Systems:  All review of systems negative except what is listed in the HPI  Objective:     Alert and oriented x 4 Speaking in clear sentences with no shortness of breath. No distress.  Impression and Recommendations:    Problem List Items Addressed This Visit     Vitamin D deficiency - Primary    Chronic longstanding vitamin D deficiency. She did complete 8 weeks of high-dose vitamin D replacement however this was unsuccessful in completely returning her values to normal. Recommend biweekly dosing of 50,000 IU vitamin D3 for the next 16 weeks to see if we can get her lab levels back up to normal. She is agreeable to this plan of care. Review of guidelines discussed that dosing can increase further if needed.  Will reevaluate labs in 6 months.      Relevant Medications   Vitamin D, Ergocalciferol, (DRISDOL) 1.25 MG (50000 UNIT) CAPS capsule   Type 2 diabetes mellitus with hyperglycemia, without long-term current use of insulin (HCC)    Hemoglobin A1c has improved from last check. Unfortunately she is unable to tolerate larger doses of metformin at this time therefore we will remain on 500 mg of metformin once a day Consider GLP-1 if at next check A1c is not where we would like it to be. Recommend continue monitoring diet and increasing activity with a goal of a minimum of 20 minutes/day of purposeful walking. Recommend that she does restart Crestor at a minimum every other day to help reduce cardiovascular risks associated with diabetes. We will plan to follow-up in 6 months with labs.      Relevant Medications   Vitamin D, Ergocalciferol, (DRISDOL) 1.25 MG (50000  UNIT) CAPS capsule   metFORMIN (GLUCOPHAGE) 500 MG tablet   rosuvastatin (CRESTOR) 20 MG tablet   Hyperlipidemia associated with type 2 diabetes mellitus (HCC)    Recent lipid levels were still elevated. She has not been taking the Crestor as a result of concerns and information that she found researching on the Internet. Discussed with the patient that a combination of metformin and rosuvastatin are a safe  combination and she is on extremely low doses of both.  I am unaware of kidney cancer findings associated with rosuvastatin and metformin dosing together. Discussed with the patient that her cardiovascular risks are increased with both of these conditions and therefore the medication is certainly warranted and helpful. She expressed understanding.  She will start rosuvastatin 20 mg every other day dosing at this time.  We will plan to follow-up with labs in 6 months.      Relevant Medications   Vitamin D, Ergocalciferol, (DRISDOL) 1.25 MG (50000 UNIT) CAPS capsule   metFORMIN (GLUCOPHAGE) 500 MG tablet   rosuvastatin (CRESTOR) 20 MG tablet    orders and follow up as documented in EMR I discussed the assessment and treatment plan with the patient. The patient was provided an opportunity to ask questions and all were answered. The patient agreed with the plan and demonstrated an understanding of the instructions.   The patient was advised to call back or seek an in-person evaluation if the symptoms worsen or if the condition fails to improve as anticipated.  Follow-Up: in 6 months  I provided 20 minutes of non-face-to-face interaction with this non face-to-face encounter including intake, same-day documentation, and chart review.   [name], NP , DNP, AGNP-c [facility] at [facility] [phone] (fax)

## 2021-05-10 NOTE — Assessment & Plan Note (Signed)
Chronic longstanding vitamin D deficiency. She did complete 8 weeks of high-dose vitamin D replacement however this was unsuccessful in completely returning her values to normal. Recommend biweekly dosing of 50,000 IU vitamin D3 for the next 16 weeks to see if we can get her lab levels back up to normal. She is agreeable to this plan of care. Review of guidelines discussed that dosing can increase further if needed.  Will reevaluate labs in 6 months.

## 2021-05-10 NOTE — Assessment & Plan Note (Signed)
Recent lipid levels were still elevated. She has not been taking the Crestor as a result of concerns and information that she found researching on the Internet. Discussed with the patient that a combination of metformin and rosuvastatin are a safe combination and she is on extremely low doses of both.  I am unaware of kidney cancer findings associated with rosuvastatin and metformin dosing together. Discussed with the patient that her cardiovascular risks are increased with both of these conditions and therefore the medication is certainly warranted and helpful. She expressed understanding.  She will start rosuvastatin 20 mg every other day dosing at this time.  We will plan to follow-up with labs in 6 months.

## 2021-05-18 ENCOUNTER — Telehealth: Payer: Self-pay | Admitting: *Deleted

## 2021-05-18 NOTE — Telephone Encounter (Signed)
error 

## 2021-05-24 NOTE — Telephone Encounter (Signed)
error 

## 2021-06-08 ENCOUNTER — Other Ambulatory Visit (HOSPITAL_BASED_OUTPATIENT_CLINIC_OR_DEPARTMENT_OTHER): Payer: Self-pay | Admitting: Nurse Practitioner

## 2021-06-08 DIAGNOSIS — E1165 Type 2 diabetes mellitus with hyperglycemia: Secondary | ICD-10-CM

## 2021-06-08 DIAGNOSIS — E1169 Type 2 diabetes mellitus with other specified complication: Secondary | ICD-10-CM

## 2021-06-10 ENCOUNTER — Ambulatory Visit: Payer: BC Managed Care – PPO | Admitting: Podiatry

## 2021-06-15 ENCOUNTER — Encounter: Payer: Self-pay | Admitting: Surgical

## 2021-06-15 NOTE — H&P (View-Only) (Signed)
Patient ID: Cassidy Leach, female    DOB: 1970-12-16, 50 y.o.   MRN: [id number]  Chief Complaint  Patient presents with   Pre-op Exam      ICD-10-CM   1. Panniculitis  M79.3       History of Present Illness: Cassidy Leach is a 50 y.o.  female  with a history of panniculitis.  She presents for preoperative evaluation for upcoming procedure, infraumbilical panniculectomy, scheduled for 07/09/2021 with Dr. Claudia Desanctis.  The patient has not had problems with anesthesia. No history of DVT/PE.  No family history of DVT/PE.  No family or personal history of bleeding or clotting disorders.  Patient is not currently taking any blood thinners.  No history of CVA/MI.   PMH Significant for: History of a Lap-Band with subsequent conversion to gastric sleeve. Diabetes mellitus type 2 with most recent A1c 6.3%, reports she is now prediabetic per her PCP.  Vitamin D deficiency.  Hyperlipidemia.  Asthma -no recent changes or issues.  Reports a history of OSA and used a CPAP approximately 5 years ago, but has not had any issues since.  She is not currently using CPAP.  She does not have any issues with sleep apnea symptoms.  Past Medical History: Allergies: Not on File  Current Medications:  Current Outpatient Medications:    azelastine (ASTELIN) 0.1 % nasal spray, Place 2 sprays into both nostrils 2 (two) times daily. Use in each nostril as directed, Disp: 30 mL, Rfl: 0   azelastine (OPTIVAR) 0.05 % ophthalmic solution, Place 1 drop into both eyes 2 (two) times daily., Disp: 6 mL, Rfl: 0   benzonatate (TESSALON) 100 MG capsule, Take 100 mg by mouth 3 (three) times daily., Disp: , Rfl:    blood glucose meter kit and supplies, Monitor blood sugar every morning before eating or drinking and write on log. May monitor at additional times during the day if having symptoms., Disp: 1 each, Rfl: 99   cetirizine (ZYRTEC) 10 MG tablet, Take 1 tablet by mouth daily., Disp: , Rfl:    erythromycin ophthalmic  ointment, Place 1 application into both eyes at bedtime., Disp: 3.5 g, Rfl: 0   fluticasone (FLONASE) 50 MCG/ACT nasal spray, Place into both nostrils., Disp: , Rfl:    Lancets (ONETOUCH DELICA PLUS [id number]) MISC, Apply topically every morning., Disp: , Rfl:    lidocaine (XYLOCAINE) 5 % ointment, Apply 1 application topically as needed., Disp: 30 g, Rfl: 1   metFORMIN (GLUCOPHAGE) 500 MG tablet, TAKE 1 TABLET BY MOUTH DAILY WITH BREAKFAST AND 2 TABLETS DAILY AFTER SUPPER, Disp: 270 tablet, Rfl: 0   nystatin powder, Apply 1 application topically 3 (three) times daily., Disp: 15 g, Rfl: 0   omeprazole (PRILOSEC) 40 MG capsule, TAKE 1 CAPSULE(40 MG) BY MOUTH DAILY, Disp: 90 capsule, Rfl: 3   ONETOUCH ULTRA test strip, USE TO MONITOR BLOOD SUGAR LEVELS EVERY MORNING BEFORE EATING OR DRINKING AND WRITE IN LOG. MAY MONITOR UPTO FOUR TIMES A DAY IF HAVING SYMPTOMS., Disp: , Rfl:    Polyethyl Glycol-Propyl Glycol (SYSTANE) 0.4-0.3 % SOLN, 1-2 drops per eye twice daily as needed, Disp: 15 mL, Rfl: 0   rosuvastatin (CRESTOR) 20 MG tablet, Take 1 tablet (20 mg total) by mouth daily., Disp: 90 tablet, Rfl: 3   Vitamin D, Ergocalciferol, (DRISDOL) 1.25 MG (50000 UNIT) CAPS capsule, Take one tablet by mouth twice a week for 16 weeks., Disp: 32 capsule, Rfl: 0   gabapentin (NEURONTIN) 300 MG capsule, Take 1  capsule (300 mg total) by mouth at bedtime for 21 days, THEN 1 capsule (300 mg total) 2 (two) times daily for 21 days., Disp: 63 capsule, Rfl: 2   levocetirizine (XYZAL) 5 MG tablet, Take 1 tablet (5 mg total) by mouth every evening., Disp: 30 tablet, Rfl: 0  Past Medical Problems: Past Medical History:  Diagnosis Date   Allergy    Seasonal   Asthma    Encounter to establish care 11/10/2020   Hyperlipidemia    Impaired fasting glucose 05/17/2017   Myalgia and myositis     Past Surgical History: Past Surgical History:  Procedure Laterality Date   ABDOMINAL HYSTERECTOMY     TUBAL LIGATION       Social History: Social History   Socioeconomic History   Marital status: Single    Spouse name: Not on file   Number of children: 3   Years of education: Not on file   Highest education level: Not on file  Occupational History   Not on file  Tobacco Use   Smoking status: Never   Smokeless tobacco: Never  Vaping Use   Vaping Use: Never used  Substance and Sexual Activity   Alcohol use: Yes    Alcohol/week: 2.0 standard drinks    Types: 2 Standard drinks or equivalent per week   Drug use: Never   Sexual activity: Not Currently    Partners: Male    Birth control/protection: Abstinence  Other Topics Concern   Not on file  Social History Narrative   Not on file   Social Determinants of Health   Financial Resource Strain: Not on file  Food Insecurity: Not on file  Transportation Needs: Not on file  Physical Activity: Not on file  Stress: Not on file  Social Connections: Moderately Integrated   Frequency of Communication with Friends and Family: More than three times a week   Frequency of Social Gatherings with Friends and Family: More than three times a week   Attends Religious Services: 1 to 4 times per year   Active Member of [employer] or Organizations: Yes   Attends Archivist Meetings: 1 to 4 times per year   Marital Status: Never married  Human resources officer Violence: Not At Risk   Fear of Current or Ex-Partner: No   Emotionally Abused: No   Physically Abused: No   Sexually Abused: No    Family History: History reviewed. No pertinent family history.  Review of Systems: Review of Systems  Constitutional: Negative.   Respiratory: Negative.    Cardiovascular: Negative.   Gastrointestinal: Negative.   Neurological: Negative.    Physical Exam: Vital Signs BP 129/89 (BP Location: Left Arm, Patient Position: Sitting, Cuff Size: Large)    Pulse 84    Ht 5' 7"  (1.702 m)    Wt 234 lb (106.1 kg)    SpO2 92%    BMI 36.65 kg/m   Physical Exam   Constitutional:      General: Not in acute distress.    Appearance: Normal appearance. Not ill-appearing.  HENT:     Head: Normocephalic and atraumatic.  Eyes:     Pupils: Pupils are equal, round Neck:     Musculoskeletal: Normal range of motion.  Cardiovascular:     Rate and Rhythm: Normal rate    Pulses: Normal pulses.  Pulmonary:     Effort: Pulmonary effort is normal. No respiratory distress.  Abdominal:     General: Abdomen is flat. There is no distension.  Musculoskeletal: Normal  range of motion.  Skin:    General: Skin is warm and dry.     Findings: No erythema or rash.  Neurological:     General: No focal deficit present.     Mental Status: Alert and oriented to person, place, and time. Mental status is at baseline.     Motor: No weakness.  Psychiatric:        Mood and Affect: Mood normal.        Behavior: Behavior normal.    Assessment/Plan: The patient is scheduled for infraumbilical panniculectomy with Dr. Claudia Desanctis.  Risks, benefits, and alternatives of procedure discussed, questions answered and consent obtained.    Smoking Status: Non-smoker; Counseling Given?  N/A  Caprini Score: 4, moderate; Risk Factors include: Age, BMI greater than 25, and length of planned surgery. Recommendation for mechanical  prophylaxis. Encourage early ambulation.   Pictures obtained: @consult   Post-op Rx sent to pharmacy: [place], [place]  Patient was provided with the General Surgical Risk consent document and Pain Medication Agreement prior to their appointment.  They had adequate time to read through the risk consent documents and Pain Medication Agreement. We also discussed them in person together during this preop appointment. All of their questions were answered to their satisfaction.  Recommended calling if they have any further questions.  Risk consent form and Pain Medication Agreement to be scanned into patient's chart.  The risk that can be encountered for this procedure were  discussed and include the following but not limited to these: asymmetry, fluid accumulation, firmness of the tissue, skin loss, decrease or no sensation, fat necrosis, bleeding, infection, healing delay.  Deep vein thrombosis, cardiac and pulmonary complications are risks to any procedure.  There are risks of anesthesia, changes to skin sensation and injury to nerves or blood vessels.  The muscle can be temporarily or permanently injured.  You may have an allergic reaction to tape, suture, glue, blood products which can result in skin discoloration, swelling, pain, skin lesions, poor healing.  Any of these can lead to the need for revisonal surgery or stage procedures.  Weight gain and weigh loss can also effect the long term appearance. The results are not guaranteed to last a lifetime.  Future surgery may be required.    We discussed the limitations of panniculectomy which involved no liposuction, no transposition of the umbilicus.  Patient was understanding and in agreement with this.  We discussed the specifics of the incisions and drains.  Electronically signed by: Carola Rhine Elsy Chiang, PA-C 06/16/2021 10:58 AM

## 2021-06-15 NOTE — Telephone Encounter (Signed)
Error message

## 2021-06-15 NOTE — Progress Notes (Signed)
Patient ID: Cassidy Leach, female    DOB: [birth year], 50 y.o.   MRN: [id number]  Chief Complaint  Patient presents with   Pre-op Exam      ICD-10-CM   1. Panniculitis  M79.3       History of Present Illness: Cassidy Leach is a 50 y.o.  female  with a history of panniculitis.  She presents for preoperative evaluation for upcoming procedure, infraumbilical panniculectomy, scheduled for 07/09/2021 with Dr. Claudia Desanctis.  The patient has not had problems with anesthesia. No history of DVT/PE.  No family history of DVT/PE.  No family or personal history of bleeding or clotting disorders.  Patient is not currently taking any blood thinners.  No history of CVA/MI.   PMH Significant for: History of a Lap-Band with subsequent conversion to gastric sleeve. Diabetes mellitus type 2 with most recent A1c 6.3%, reports she is now prediabetic per her PCP.  Vitamin D deficiency.  Hyperlipidemia.  Asthma -no recent changes or issues.  Reports a history of OSA and used a CPAP approximately 5 years ago, but has not had any issues since.  She is not currently using CPAP.  She does not have any issues with sleep apnea symptoms.  Past Medical History: Allergies: Not on File  Current Medications:  Current Outpatient Medications:    azelastine (ASTELIN) 0.1 % nasal spray, Place 2 sprays into both nostrils 2 (two) times daily. Use in each nostril as directed, Disp: 30 mL, Rfl: 0   azelastine (OPTIVAR) 0.05 % ophthalmic solution, Place 1 drop into both eyes 2 (two) times daily., Disp: 6 mL, Rfl: 0   benzonatate (TESSALON) 100 MG capsule, Take 100 mg by mouth 3 (three) times daily., Disp: , Rfl:    blood glucose meter kit and supplies, Monitor blood sugar every morning before eating or drinking and write on log. May monitor at additional times during the day if having symptoms., Disp: 1 each, Rfl: 99   cetirizine (ZYRTEC) 10 MG tablet, Take 1 tablet by mouth daily., Disp: , Rfl:    erythromycin ophthalmic  ointment, Place 1 application into both eyes at bedtime., Disp: 3.5 g, Rfl: 0   fluticasone (FLONASE) 50 MCG/ACT nasal spray, Place into both nostrils., Disp: , Rfl:    Lancets (ONETOUCH DELICA PLUS [id number]) MISC, Apply topically every morning., Disp: , Rfl:    lidocaine (XYLOCAINE) 5 % ointment, Apply 1 application topically as needed., Disp: 30 g, Rfl: 1   metFORMIN (GLUCOPHAGE) 500 MG tablet, TAKE 1 TABLET BY MOUTH DAILY WITH BREAKFAST AND 2 TABLETS DAILY AFTER SUPPER, Disp: 270 tablet, Rfl: 0   nystatin powder, Apply 1 application topically 3 (three) times daily., Disp: 15 g, Rfl: 0   omeprazole (PRILOSEC) 40 MG capsule, TAKE 1 CAPSULE(40 MG) BY MOUTH DAILY, Disp: 90 capsule, Rfl: 3   ONETOUCH ULTRA test strip, USE TO MONITOR BLOOD SUGAR LEVELS EVERY MORNING BEFORE EATING OR DRINKING AND WRITE IN LOG. MAY MONITOR UPTO FOUR TIMES A DAY IF HAVING SYMPTOMS., Disp: , Rfl:    Polyethyl Glycol-Propyl Glycol (SYSTANE) 0.4-0.3 % SOLN, 1-2 drops per eye twice daily as needed, Disp: 15 mL, Rfl: 0   rosuvastatin (CRESTOR) 20 MG tablet, Take 1 tablet (20 mg total) by mouth daily., Disp: 90 tablet, Rfl: 3   Vitamin D, Ergocalciferol, (DRISDOL) 1.25 MG (50000 UNIT) CAPS capsule, Take one tablet by mouth twice a week for 16 weeks., Disp: 32 capsule, Rfl: 0   gabapentin (NEURONTIN) 300 MG capsule, Take 1  capsule (300 mg total) by mouth at bedtime for 21 days, THEN 1 capsule (300 mg total) 2 (two) times daily for 21 days., Disp: 63 capsule, Rfl: 2   levocetirizine (XYZAL) 5 MG tablet, Take 1 tablet (5 mg total) by mouth every evening., Disp: 30 tablet, Rfl: 0  Past Medical Problems: Past Medical History:  Diagnosis Date   Allergy    Seasonal   Asthma    Encounter to establish care 11/10/2020   Hyperlipidemia    Impaired fasting glucose 05/17/2017   Myalgia and myositis     Past Surgical History: Past Surgical History:  Procedure Laterality Date   ABDOMINAL HYSTERECTOMY     TUBAL LIGATION       Social History: Social History   Socioeconomic History   Marital status: Single    Spouse name: Not on file   Number of children: 3   Years of education: Not on file   Highest education level: Not on file  Occupational History   Not on file  Tobacco Use   Smoking status: Never   Smokeless tobacco: Never  Vaping Use   Vaping Use: Never used  Substance and Sexual Activity   Alcohol use: Yes    Alcohol/week: 2.0 standard drinks    Types: 2 Standard drinks or equivalent per week   Drug use: Never   Sexual activity: Not Currently    Partners: Male    Birth control/protection: Abstinence  Other Topics Concern   Not on file  Social History Narrative   Not on file   Social Determinants of Health   Financial Resource Strain: Not on file  Food Insecurity: Not on file  Transportation Needs: Not on file  Physical Activity: Not on file  Stress: Not on file  Social Connections: Moderately Integrated   Frequency of Communication with Friends and Family: More than three times a week   Frequency of Social Gatherings with Friends and Family: More than three times a week   Attends Religious Services: 1 to 4 times per year   Active Member of [employer] or Organizations: Yes   Attends Archivist Meetings: 1 to 4 times per year   Marital Status: Never married  Human resources officer Violence: Not At Risk   Fear of Current or Ex-Partner: No   Emotionally Abused: No   Physically Abused: No   Sexually Abused: No    Family History: History reviewed. No pertinent family history.  Review of Systems: Review of Systems  Constitutional: Negative.   Respiratory: Negative.    Cardiovascular: Negative.   Gastrointestinal: Negative.   Neurological: Negative.    Physical Exam: Vital Signs BP 129/89 (BP Location: Left Arm, Patient Position: Sitting, Cuff Size: Large)   Pulse 84   Ht 5' 7"  (1.702 m)   Wt 234 lb (106.1 kg)   SpO2 92%   BMI 36.65 kg/m   Physical Exam   Constitutional:      General: Not in acute distress.    Appearance: Normal appearance. Not ill-appearing.  HENT:     Head: Normocephalic and atraumatic.  Eyes:     Pupils: Pupils are equal, round Neck:     Musculoskeletal: Normal range of motion.  Cardiovascular:     Rate and Rhythm: Normal rate    Pulses: Normal pulses.  Pulmonary:     Effort: Pulmonary effort is normal. No respiratory distress.  Abdominal:     General: Abdomen is flat. There is no distension.  Musculoskeletal: Normal range of motion.  Skin:  General: Skin is warm and dry.     Findings: No erythema or rash.  Neurological:     General: No focal deficit present.     Mental Status: Alert and oriented to person, place, and time. Mental status is at baseline.     Motor: No weakness.  Psychiatric:        Mood and Affect: Mood normal.        Behavior: Behavior normal.    Assessment/Plan: The patient is scheduled for infraumbilical panniculectomy with Dr. Claudia Desanctis.  Risks, benefits, and alternatives of procedure discussed, questions answered and consent obtained.    Smoking Status: Non-smoker; Counseling Given?  N/A  Caprini Score: 4, moderate; Risk Factors include: Age, BMI greater than 25, and length of planned surgery. Recommendation for mechanical  prophylaxis. Encourage early ambulation.   Pictures obtained: @consult   Post-op Rx sent to pharmacy: [place], [place]  Patient was provided with the General Surgical Risk consent document and Pain Medication Agreement prior to their appointment.  They had adequate time to read through the risk consent documents and Pain Medication Agreement. We also discussed them in person together during this preop appointment. All of their questions were answered to their satisfaction.  Recommended calling if they have any further questions.  Risk consent form and Pain Medication Agreement to be scanned into patient's chart.  The risk that can be encountered for this procedure were  discussed and include the following but not limited to these: asymmetry, fluid accumulation, firmness of the tissue, skin loss, decrease or no sensation, fat necrosis, bleeding, infection, healing delay.  Deep vein thrombosis, cardiac and pulmonary complications are risks to any procedure.  There are risks of anesthesia, changes to skin sensation and injury to nerves or blood vessels.  The muscle can be temporarily or permanently injured.  You may have an allergic reaction to tape, suture, glue, blood products which can result in skin discoloration, swelling, pain, skin lesions, poor healing.  Any of these can lead to the need for revisonal surgery or stage procedures.  Weight gain and weigh loss can also effect the long term appearance. The results are not guaranteed to last a lifetime.  Future surgery may be required.    We discussed the limitations of panniculectomy which involved no liposuction, no transposition of the umbilicus.  Patient was understanding and in agreement with this.  We discussed the specifics of the incisions and drains.  Electronically signed by: Carola Rhine Remell Giaimo, PA-C 06/16/2021 10:58 AM

## 2021-06-16 ENCOUNTER — Ambulatory Visit (INDEPENDENT_AMBULATORY_CARE_PROVIDER_SITE_OTHER): Payer: BC Managed Care – PPO | Admitting: Surgical

## 2021-06-16 ENCOUNTER — Other Ambulatory Visit: Payer: Self-pay

## 2021-06-16 VITALS — BP 129/89 | HR 84 | Ht 67.0 in | Wt 234.0 lb

## 2021-06-16 DIAGNOSIS — M793 Panniculitis, unspecified: Secondary | ICD-10-CM

## 2021-06-16 MED ORDER — ONDANSETRON HCL 4 MG PO TABS: 4.0000 mg | ORAL_TABLET | Freq: Three times a day (TID) | ORAL | 0 refills | Status: DC | PRN

## 2021-06-16 MED ORDER — HYDROCODONE-ACETAMINOPHEN 5-325 MG PO TABS: 1.0000 | ORAL_TABLET | Freq: Four times a day (QID) | ORAL | 0 refills | Status: AC | PRN

## 2021-07-02 ENCOUNTER — Other Ambulatory Visit: Payer: Self-pay

## 2021-07-02 ENCOUNTER — Encounter (HOSPITAL_BASED_OUTPATIENT_CLINIC_OR_DEPARTMENT_OTHER): Payer: Self-pay | Admitting: *Deleted

## 2021-07-05 ENCOUNTER — Encounter (HOSPITAL_BASED_OUTPATIENT_CLINIC_OR_DEPARTMENT_OTHER)
Admission: RE | Admit: 2021-07-05 | Discharge: 2021-07-05 | Disposition: A | Discharge status: Home / Self Care | Payer: BC Managed Care – PPO | Source: Ambulatory Visit | Attending: Plastic Surgery | Admitting: Plastic Surgery

## 2021-07-05 DIAGNOSIS — Z01818 Encounter for other preprocedural examination: Secondary | ICD-10-CM | POA: Insufficient documentation

## 2021-07-05 LAB — BASIC METABOLIC PANEL
Anion gap: 9 (ref 5–15)
BUN: 11 mg/dL (ref 6–20)
CO2: 23 mmol/L (ref 22–32)
Calcium: 9.3 mg/dL (ref 8.9–10.3)
Chloride: 106 mmol/L (ref 98–111)
Creatinine, Ser: 0.79 mg/dL (ref 0.44–1.00)
GFR, Estimated: >60 mL/min (ref >60)
Glucose, Bld: 124 mg/dL — ABNORMAL HIGH (ref 70–99)
Potassium: 4.6 mmol/L (ref 3.5–5.1)
Sodium: 138 mmol/L (ref 135–145)

## 2021-07-07 NOTE — Progress Notes (Signed)
Called Dr. Thomos Lemons office with reminder to put orders in. Understood.

## 2021-07-08 ENCOUNTER — Encounter (HOSPITAL_BASED_OUTPATIENT_CLINIC_OR_DEPARTMENT_OTHER): Payer: Self-pay | Admitting: Plastic Surgery

## 2021-07-09 ENCOUNTER — Ambulatory Visit (HOSPITAL_BASED_OUTPATIENT_CLINIC_OR_DEPARTMENT_OTHER): Payer: BC Managed Care – PPO | Admitting: Anesthesiology

## 2021-07-09 ENCOUNTER — Encounter (HOSPITAL_BASED_OUTPATIENT_CLINIC_OR_DEPARTMENT_OTHER)
Admission: RE | Disposition: A | Discharge status: Home / Self Care | Payer: Self-pay | Source: Home / Self Care | Attending: Plastic Surgery

## 2021-07-09 ENCOUNTER — Encounter (HOSPITAL_BASED_OUTPATIENT_CLINIC_OR_DEPARTMENT_OTHER): Payer: Self-pay | Admitting: Plastic Surgery

## 2021-07-09 ENCOUNTER — Ambulatory Visit (HOSPITAL_BASED_OUTPATIENT_CLINIC_OR_DEPARTMENT_OTHER)
Admission: RE | Admit: 2021-07-09 | Discharge: 2021-07-09 | Disposition: A | Discharge status: Home / Self Care | Payer: BC Managed Care – PPO | Attending: Plastic Surgery | Admitting: Plastic Surgery

## 2021-07-09 ENCOUNTER — Other Ambulatory Visit: Payer: Self-pay

## 2021-07-09 DIAGNOSIS — Z6836 Body mass index (BMI) 36.0-36.9, adult: Secondary | ICD-10-CM | POA: Insufficient documentation

## 2021-07-09 DIAGNOSIS — E785 Hyperlipidemia, unspecified: Secondary | ICD-10-CM | POA: Diagnosis not present

## 2021-07-09 DIAGNOSIS — K219 Gastro-esophageal reflux disease without esophagitis: Secondary | ICD-10-CM | POA: Diagnosis not present

## 2021-07-09 DIAGNOSIS — M793 Panniculitis, unspecified: Secondary | ICD-10-CM | POA: Insufficient documentation

## 2021-07-09 DIAGNOSIS — E669 Obesity, unspecified: Secondary | ICD-10-CM | POA: Diagnosis not present

## 2021-07-09 DIAGNOSIS — E65 Localized adiposity: Secondary | ICD-10-CM | POA: Diagnosis not present

## 2021-07-09 DIAGNOSIS — E1165 Type 2 diabetes mellitus with hyperglycemia: Secondary | ICD-10-CM

## 2021-07-09 DIAGNOSIS — E559 Vitamin D deficiency, unspecified: Secondary | ICD-10-CM | POA: Diagnosis not present

## 2021-07-09 HISTORY — DX: Type 2 diabetes mellitus without complications: E11.9

## 2021-07-09 HISTORY — PX: PANNICULECTOMY: SHX5360

## 2021-07-09 LAB — GLUCOSE, CAPILLARY
Glucose-Capillary: 136 mg/dL — ABNORMAL HIGH (ref 70–99)
Glucose-Capillary: 131 mg/dL — ABNORMAL HIGH (ref 70–99)

## 2021-07-09 SURGERY — PANNICULECTOMY
Anesthesia: General | Site: Abdomen

## 2021-07-09 MED ORDER — TRANEXAMIC ACID-NACL 1000-0.7 MG/100ML-% IV SOLN: 1000.0000 mg | INTRAVENOUS | Status: DC

## 2021-07-09 MED ORDER — FENTANYL CITRATE (PF) 100 MCG/2ML IJ SOLN
INTRAMUSCULAR | Status: AC
  Filled 2021-07-09: qty 2

## 2021-07-09 MED ORDER — MEPERIDINE HCL 25 MG/ML IJ SOLN: 6.2500 mg | INTRAMUSCULAR | Status: DC | PRN

## 2021-07-09 MED ORDER — PROMETHAZINE HCL 25 MG/ML IJ SOLN: 6.2500 mg | INTRAMUSCULAR | Status: DC | PRN

## 2021-07-09 MED ORDER — ROCURONIUM BROMIDE 10 MG/ML (PF) SYRINGE
PREFILLED_SYRINGE | INTRAVENOUS | Status: AC
  Filled 2021-07-09: qty 10

## 2021-07-09 MED ORDER — HYDROMORPHONE HCL 1 MG/ML IJ SOLN
INTRAMUSCULAR | Status: AC
  Filled 2021-07-09: qty 0.5

## 2021-07-09 MED ORDER — CHLORHEXIDINE GLUCONATE CLOTH 2 % EX PADS: 6.0000 | MEDICATED_PAD | Freq: Once | CUTANEOUS | Status: DC

## 2021-07-09 MED ORDER — ONDANSETRON HCL 4 MG/2ML IJ SOLN
INTRAMUSCULAR | Status: AC
  Filled 2021-07-09: qty 2

## 2021-07-09 MED ORDER — PHENYLEPHRINE 40 MCG/ML (10ML) SYRINGE FOR IV PUSH (FOR BLOOD PRESSURE SUPPORT)
PREFILLED_SYRINGE | INTRAVENOUS | Status: AC
  Filled 2021-07-09: qty 10

## 2021-07-09 MED ORDER — MIDAZOLAM HCL 2 MG/2ML IJ SOLN
INTRAMUSCULAR | Status: AC
  Filled 2021-07-09: qty 2

## 2021-07-09 MED ORDER — LIDOCAINE HCL (CARDIAC) PF 100 MG/5ML IV SOSY
PREFILLED_SYRINGE | INTRAVENOUS | Status: DC | PRN
  Administered 2021-07-09: 100 mg via INTRAVENOUS

## 2021-07-09 MED ORDER — OXYCODONE HCL 5 MG PO TABS
ORAL_TABLET | ORAL | Status: AC
  Filled 2021-07-09: qty 1

## 2021-07-09 MED ORDER — OXYCODONE HCL 5 MG/5ML PO SOLN: 5.0000 mg | Freq: Once | ORAL | Status: DC | PRN

## 2021-07-09 MED ORDER — PHENYLEPHRINE HCL (PRESSORS) 10 MG/ML IV SOLN
INTRAVENOUS | Status: DC | PRN
  Administered 2021-07-09: 80 ug via INTRAVENOUS
  Administered 2021-07-09: 120 ug via INTRAVENOUS
  Administered 2021-07-09 (×2): 80 ug via INTRAVENOUS

## 2021-07-09 MED ORDER — PROPOFOL 10 MG/ML IV BOLUS
INTRAVENOUS | Status: AC
  Filled 2021-07-09: qty 20

## 2021-07-09 MED ORDER — AMISULPRIDE (ANTIEMETIC) 5 MG/2ML IV SOLN: 10.0000 mg | Freq: Once | INTRAVENOUS | Status: DC | PRN

## 2021-07-09 MED ORDER — LACTATED RINGERS IV SOLN: INTRAVENOUS | Status: DC | PRN

## 2021-07-09 MED ORDER — LACTATED RINGERS IV SOLN: INTRAVENOUS | Status: DC

## 2021-07-09 MED ORDER — CEFAZOLIN SODIUM-DEXTROSE 2-4 GM/100ML-% IV SOLN
2.0000 g | INTRAVENOUS | Status: AC
  Administered 2021-07-09: 2 g via INTRAVENOUS

## 2021-07-09 MED ORDER — ONDANSETRON HCL 4 MG/2ML IJ SOLN
INTRAMUSCULAR | Status: DC | PRN
  Administered 2021-07-09: 4 mg via INTRAVENOUS

## 2021-07-09 MED ORDER — LIDOCAINE 2% (20 MG/ML) 5 ML SYRINGE
INTRAMUSCULAR | Status: AC
  Filled 2021-07-09: qty 5

## 2021-07-09 MED ORDER — OXYCODONE HCL 5 MG PO TABS: 5.0000 mg | ORAL_TABLET | Freq: Once | ORAL | Status: DC | PRN

## 2021-07-09 MED ORDER — FENTANYL CITRATE (PF) 100 MCG/2ML IJ SOLN
INTRAMUSCULAR | Status: DC | PRN
  Administered 2021-07-09 (×2): 50 ug via INTRAVENOUS
  Administered 2021-07-09: 100 ug via INTRAVENOUS

## 2021-07-09 MED ORDER — DEXAMETHASONE SODIUM PHOSPHATE 10 MG/ML IJ SOLN
INTRAMUSCULAR | Status: AC
  Filled 2021-07-09: qty 1

## 2021-07-09 MED ORDER — SUGAMMADEX SODIUM 200 MG/2ML IV SOLN
INTRAVENOUS | Status: DC | PRN
  Administered 2021-07-09: 200 mg via INTRAVENOUS

## 2021-07-09 MED ORDER — AMISULPRIDE (ANTIEMETIC) 5 MG/2ML IV SOLN
INTRAVENOUS | Status: AC
  Filled 2021-07-09: qty 2

## 2021-07-09 MED ORDER — MIDAZOLAM HCL 5 MG/5ML IJ SOLN
INTRAMUSCULAR | Status: DC | PRN
  Administered 2021-07-09: 2 mg via INTRAVENOUS

## 2021-07-09 MED ORDER — PROPOFOL 10 MG/ML IV BOLUS
INTRAVENOUS | Status: DC | PRN
  Administered 2021-07-09: 170 mg via INTRAVENOUS

## 2021-07-09 MED ORDER — ROCURONIUM BROMIDE 100 MG/10ML IV SOLN
INTRAVENOUS | Status: DC | PRN
  Administered 2021-07-09: 100 mg via INTRAVENOUS

## 2021-07-09 MED ORDER — DEXAMETHASONE SODIUM PHOSPHATE 4 MG/ML IJ SOLN
INTRAMUSCULAR | Status: DC | PRN
  Administered 2021-07-09: 5 mg via INTRAVENOUS

## 2021-07-09 MED ORDER — CEFAZOLIN SODIUM 1 G IJ SOLR
INTRAMUSCULAR | Status: AC
  Filled 2021-07-09: qty 20

## 2021-07-09 MED ORDER — LABETALOL HCL 5 MG/ML IV SOLN
INTRAVENOUS | Status: DC | PRN
  Administered 2021-07-09: 5 mg via INTRAVENOUS

## 2021-07-09 MED ORDER — HYDROMORPHONE HCL 1 MG/ML IJ SOLN
0.2500 mg | INTRAMUSCULAR | Status: DC | PRN
  Administered 2021-07-09 (×4): 0.5 mg via INTRAVENOUS

## 2021-07-09 SURGICAL SUPPLY — 56 items
APL PRP STRL LF DISP 70% ISPRP (MISCELLANEOUS) ×1
APL SKNCLS STERI-STRIP NONHPOA (GAUZE/BANDAGES/DRESSINGS) ×2
BENZOIN TINCTURE PRP APPL 2/3 (GAUZE/BANDAGES/DRESSINGS) ×6 IMPLANT
BIOPATCH RED 1 DISK 7.0 (GAUZE/BANDAGES/DRESSINGS) ×2 IMPLANT
BIOPATCH RED 1IN DISK 7.0MM (GAUZE/BANDAGES/DRESSINGS) ×2
BLADE HEX COATED 2.75 (ELECTRODE) ×2 IMPLANT
BLADE SURG 10 STRL SS (BLADE) ×3 IMPLANT
BLADE SURG 11 STRL SS (BLADE) ×3 IMPLANT
BLADE SURG 15 STRL LF DISP TIS (BLADE) IMPLANT
BLADE SURG 15 STRL SS (BLADE)
CANISTER SUCT 1200ML W/VALVE (MISCELLANEOUS) ×3 IMPLANT
CHLORAPREP W/TINT 26 (MISCELLANEOUS) ×3 IMPLANT
COVER BACK TABLE 60X90IN (DRAPES) ×3 IMPLANT
COVER MAYO STAND STRL (DRAPES) ×3 IMPLANT
DRAIN CHANNEL 15F RND FF W/TCR (WOUND CARE) ×6 IMPLANT
DRAPE LAPAROSCOPIC ABDOMINAL (DRAPES) ×3 IMPLANT
DRAPE UTILITY XL STRL (DRAPES) ×3 IMPLANT
DRSG PAD ABDOMINAL 8X10 ST (GAUZE/BANDAGES/DRESSINGS) ×9 IMPLANT
DRSG TEGADERM 2-3/8X2-3/4 SM (GAUZE/BANDAGES/DRESSINGS) ×4 IMPLANT
ELECT BLADE 4.0 EZ CLEAN MEGAD (MISCELLANEOUS) ×3
ELECT COATED BLADE 2.86 ST (ELECTRODE) IMPLANT
ELECT REM PT RETURN 9FT ADLT (ELECTROSURGICAL) ×6
ELECTRODE BLDE 4.0 EZ CLN MEGD (MISCELLANEOUS) IMPLANT
ELECTRODE REM PT RTRN 9FT ADLT (ELECTROSURGICAL) ×1 IMPLANT
EVACUATOR SILICONE 100CC (DRAIN) ×6 IMPLANT
GAUZE SPONGE 4X4 12PLY STRL (GAUZE/BANDAGES/DRESSINGS) IMPLANT
GLOVE SRG 8 PF TXTR STRL LF DI (GLOVE) ×1 IMPLANT
GLOVE SURG ENC TEXT LTX SZ7.5 (GLOVE) ×3 IMPLANT
GLOVE SURG UNDER POLY LF SZ8 (GLOVE) ×3
GOWN STRL REUS W/ TWL LRG LVL3 (GOWN DISPOSABLE) ×3 IMPLANT
GOWN STRL REUS W/TWL LRG LVL3 (GOWN DISPOSABLE) ×6
NS IRRIG 1000ML POUR BTL (IV SOLUTION) ×3 IMPLANT
PACK BASIN DAY SURGERY FS (CUSTOM PROCEDURE TRAY) ×3 IMPLANT
PENCIL SMOKE EVACUATOR (MISCELLANEOUS) ×5 IMPLANT
PIN SAFETY STERILE (MISCELLANEOUS) ×3 IMPLANT
SLEEVE SCD COMPRESS KNEE MED (STOCKING) ×3 IMPLANT
SPONGE T-LAP 18X18 ~~LOC~~+RFID (SPONGE) ×10 IMPLANT
STAPLER INSORB 30 2030 C-SECTI (MISCELLANEOUS) ×5 IMPLANT
STAPLER VISISTAT 35W (STAPLE) ×3 IMPLANT
STRIP SUTURE WOUND CLOSURE 1/2 (MISCELLANEOUS) ×9 IMPLANT
SUT ETHILON 2 0 FS 18 (SUTURE) ×6 IMPLANT
SUT MNCRL AB 4-0 PS2 18 (SUTURE) ×2 IMPLANT
SUT PDS AB 0 CT 36 (SUTURE) ×2 IMPLANT
SUT PDS AB 2-0 CT2 27 (SUTURE) ×3 IMPLANT
SUT VIC AB 2-0 CT1 27 (SUTURE) ×12
SUT VIC AB 2-0 CT1 TAPERPNT 27 (SUTURE) ×4 IMPLANT
SUT VLOC 180 0 24IN GS25 (SUTURE) IMPLANT
SUT VLOC 180 P-14 24 (SUTURE) ×4 IMPLANT
SUT VLOC 90 P-14 23 (SUTURE) ×2 IMPLANT
SYR BULB IRRIG 60ML STRL (SYRINGE) ×3 IMPLANT
TOWEL GREEN STERILE FF (TOWEL DISPOSABLE) ×6 IMPLANT
TUBE CONNECTING 20'X1/4 (TUBING) ×1
TUBE CONNECTING 20X1/4 (TUBING) ×2 IMPLANT
TUBING INFILTRATION IT-10001 (TUBING) ×3 IMPLANT
UNDERPAD 30X36 HEAVY ABSORB (UNDERPADS AND DIAPERS) ×6 IMPLANT
YANKAUER SUCT BULB TIP NO VENT (SUCTIONS) ×3 IMPLANT

## 2021-07-09 NOTE — Discharge Instructions (Addendum)
Activity As tolerated: NO showers until 3 days after surgery. Keep binder on 24/7 unless showering or changing dressings. This is important to prevent additional swelling/bleeding.  NO driving while in pain, taking pain medication or if you are unable to safely react to traffic. No heavy activities. No lifting > 15 pounds.  Take Pain medication (Norco) as needed for severe pain. Otherwise, you can use ibuprofen or tylenol PRN as well as indicated on your pre-op breast reduction instruction sheet provided at your pre-op appointment.  Diet: Regular. Drink plenty of fluids (water, avoid juice/soda) and eat healthy, high protein, low carbs.  Wound Care: Keep dressing clean & dry. You may change bandages after showering if you continue to notice some drainage.  Special Instructions: Call Doctor if any unusual problems occur such as pain, excessive Bleeding, unrelieved Nausea/vomiting, Fever &/or chills Sleep in a recliner or reclined bed, or prop your upper body up with pillows in bed.   Drains: Measure drain output from drains every 24 hours. Record this on a log for [place] to view during post-op appointments. Drainage will change colors over the next week to two weeks. This is normal. Color of drainage can vary from red-pink-orange-yellow.  Follow-up appointment: Scheduled for next week.   Post Anesthesia Home Care Instructions  Activity: Get plenty of rest for the remainder of the day. A responsible individual must stay with you for 24 hours following the procedure.  For the next 24 hours, DO NOT: -Drive a car -Advertising copywriter -Drink alcoholic beverages -Take any medication unless instructed by your physician -Make any legal decisions or sign important papers.  Meals: Start with liquid foods such as gelatin or soup. Progress to regular foods as tolerated. Avoid greasy, spicy, heavy foods. If nausea and/or vomiting occur, drink only clear liquids until the nausea and/or vomiting  subsides. Call your physician if vomiting continues.  Special Instructions/Symptoms: Your throat may feel dry or sore from the anesthesia or the breathing tube placed in your throat during surgery. If this causes discomfort, gargle with warm salt water. The discomfort should disappear within 24 hours.  If you had a scopolamine patch placed behind your ear for the management of post- operative nausea and/or vomiting:  1. The medication in the patch is effective for 72 hours, after which it should be removed.  Wrap patch in a tissue and discard in the trash. Wash hands thoroughly with soap and water. 2. You may remove the patch earlier than 72 hours if you experience unpleasant side effects which may include dry mouth, dizziness or visual disturbances. 3. Avoid touching the patch. Wash your hands with soap and water after contact with the patch.      JP Drain [employer] this sheet to all of your post-operative appointments while you have your drains. Please measure your drains by CC's or ML's. Make sure you drain and measure your JP Drains 2 or 3 times per day. At the end of each day, add up totals for the left side and add up totals for the right side.    ( 9 am )     ( 3 pm )        ( 9 pm )                Date L  R  L  R  L  R  Total L/R  About my Jackson-Pratt Bulb Drain  What is a Jackson-Pratt bulb? A Jackson-Pratt is a soft, round device used to collect drainage. It is connected to a long, thin drainage catheter, which is held in place by one or two small stiches near your surgical incision site. When the bulb is squeezed, it forms a vacuum, forcing the drainage to empty into the bulb.  Emptying the Jackson-Pratt bulb- To empty the bulb: 1. Release the plug on the top of the  bulb. 2. Pour the bulb's contents into a measuring container which your nurse will provide. 3. Record the time emptied and amount of drainage. Empty the drain(s) as often as your     doctor or nurse recommends.  Date                  Time                    Amount (Drain 1)                 Amount (Drain 2)  _____________________________________________________________________  _____________________________________________________________________  _____________________________________________________________________  _____________________________________________________________________  _____________________________________________________________________  _____________________________________________________________________  _____________________________________________________________________  _____________________________________________________________________  Squeezing the Jackson-Pratt Bulb- To squeeze the bulb: 1. Make sure the plug at the top of the bulb is open. 2. Squeeze the bulb tightly in your fist. You will hear air squeezing from the bulb. 3. Replace the plug while the bulb is squeezed. 4. Use a safety pin to attach the bulb to your clothing. This will keep the catheter from     pulling at the bulb insertion site.  When to call your doctor- Call your doctor if: Drain site becomes red, swollen or hot. You have a fever greater than 101 degrees F. There is oozing at the drain site. Drain falls out (apply a guaze bandage over the drain hole and secure it with tape). Drainage increases daily not related to activity patterns. (You will usually have more drainage when you are active than when you are resting.) Drainage has a bad odor.

## 2021-07-09 NOTE — Anesthesia Preprocedure Evaluation (Signed)
Anesthesia Evaluation  Patient identified by MRN, date of birth, ID band Patient awake    Reviewed: Allergy & Precautions, NPO status , Patient's Chart, lab work & pertinent test results  Airway Mallampati: II  TM Distance: >3 FB Neck ROM: Full    Dental no notable dental hx.    Pulmonary asthma , sleep apnea ,    Pulmonary exam normal breath sounds clear to auscultation       Cardiovascular negative cardio ROS Normal cardiovascular exam Rhythm:Regular Rate:Normal     Neuro/Psych negative neurological ROS  negative psych ROS   GI/Hepatic Neg liver ROS, GERD  ,  Endo/Other  negative endocrine ROSdiabetes  Renal/GU negative Renal ROS  negative genitourinary   Musculoskeletal negative musculoskeletal ROS (+)   Abdominal (+) + obese,   Peds negative pediatric ROS (+)  Hematology negative hematology ROS (+)   Anesthesia Other Findings   Reproductive/Obstetrics negative OB ROS                             Anesthesia Physical Anesthesia Plan  ASA: 3  Anesthesia Plan: General   Post-op Pain Management:    Induction: Intravenous  PONV Risk Score and Plan: 3 and Ondansetron, Dexamethasone, Midazolam and Treatment may vary due to age or medical condition  Airway Management Planned: Oral ETT  Additional Equipment:   Intra-op Plan:   Post-operative Plan: Extubation in OR  Informed Consent: I have reviewed the patients History and Physical, chart, labs and discussed the procedure including the risks, benefits and alternatives for the proposed anesthesia with the patient or authorized representative who has indicated his/her understanding and acceptance.     Dental advisory given  Plan Discussed with: CRNA  Anesthesia Plan Comments:         Anesthesia Quick Evaluation

## 2021-07-09 NOTE — Anesthesia Procedure Notes (Signed)
Procedure Name: Intubation Date/Time: 07/09/2021 7:58 AM Performed by: Ezequiel Kayser, CRNA Pre-anesthesia Checklist: Patient identified, Emergency Drugs available, Suction available and Patient being monitored Patient Re-evaluated:Patient Re-evaluated prior to induction Oxygen Delivery Method: Circle System Utilized Preoxygenation: Pre-oxygenation with 100% oxygen Induction Type: IV induction Ventilation: Mask ventilation without difficulty Laryngoscope Size: Mac and 3 Grade View: Grade III Tube type: Oral Tube size: 7.0 mm Number of attempts: 1 Airway Equipment and Method: Stylet and Oral airway Placement Confirmation: ETT inserted through vocal cords under direct vision, positive ETCO2 and breath sounds checked- equal and bilateral Secured at: 23 cm Tube secured with: Tape Dental Injury: Teeth and Oropharynx as per pre-operative assessment

## 2021-07-09 NOTE — Transfer of Care (Signed)
Immediate Anesthesia Transfer of Care Note  Patient: Cassidy Leach  Procedure(s) Performed: PANNICULECTOMY (Abdomen)  Patient Location: PACU  Anesthesia Type:General  Level of Consciousness: drowsy  Airway & Oxygen Therapy: Patient Spontanous Breathing and Patient connected to face mask oxygen  Post-op Assessment: Report given to RN and Post -op Vital signs reviewed and stable  Post vital signs: Reviewed and stable  Last Vitals:  Vitals Value Taken Time  BP 131/82 07/09/21 0934  Temp    Pulse 86 07/09/21 0935  Resp 13 07/09/21 0935  SpO2 100 % 07/09/21 0935  Vitals shown include unvalidated device data.  Last Pain:  Vitals:   07/09/21 0639  TempSrc: Oral  PainSc: 0-No pain         Complications: No notable events documented.

## 2021-07-09 NOTE — Op Note (Signed)
Operative Note   DATE OF OPERATION: 07/09/2021  SURGICAL DEPARTMENT: Plastic Surgery  PREOPERATIVE DIAGNOSES:  Panniculitis  POSTOPERATIVE DIAGNOSES:  same  PROCEDURE:  Infraumbilical Panniculectomy  SURGEON: Ancil Linsey, MD  ASSISTANT: Zadie Cleverly, PA The advanced practice practitioner (APP) assisted throughout the case.  The APP was essential in retraction and counter traction when needed to make the case progress smoothly.  This retraction and assistance made it possible to see the tissue plans for the procedure.  The assistance was needed for blood control, tissue re-approximation and assisted with closure of the incision site.  ANESTHESIA:  General.   COMPLICATIONS: None.   INDICATIONS FOR PROCEDURE:  The patient, Cassidy Leach is a 50 y.o. female born on April 23, 1971, is here for treatment of panniculitis  MRN: [id number]  CONSENT:  Informed consent was obtained directly from the patient. Risks, benefits and alternatives were fully discussed. Specific risks including but not limited to bleeding, infection, hematoma, seroma, scarring, pain, contracture, asymmetry, wound healing problems, and need for further surgery were all discussed. The patient did have an ample opportunity to have questions answered to satisfaction.   DESCRIPTION OF PROCEDURE:  The patient was taken to the operating room. SCDs were placed and antibiotics were given. General anesthesia was administered.  The patient's operative site was prepped and draped in a sterile fashion. A time out was performed and all information was confirmed to be correct.  I started by marking the midline from the vulvar commissure to the umbilicus.  I then planned out the inferior incision just below the infra pannus crease.  I then estimated the superior incision and the amount of excision.  The area was then infiltrated with tumescent solution.  I started by making the inferior incision with a 10 blade.  I dissected down  to the fascia with cautery.  I then undermined superiorly to approximately the level of the umbilicus.  I used tailor tacking to estimate the accuracy of the superior incision.  Superior incision was then made with a 10 blade and the specimen was removed on the left and then the right side.  Total weight of the specimen was 2668g.  Hemostasis was meticulously obtained.  2 15 [place] JP drains were placed and secured with a nylon suture.  Closure was done with buried 2-0 Vicryl sutures for Scarpa's layer.  The skin was closed with a combination of in sorb staples and a running 3-0 V lock suture.  Steri-Strips and a soft dressing were then applied as well as an abdominal binder.  The patient tolerated the procedure well.  There were no complications. The patient was allowed to wake from anesthesia, extubated and taken to the recovery room in satisfactory condition.

## 2021-07-09 NOTE — Interval H&P Note (Signed)
Patient seen and examined. Rest and benefits discussed. Proceed with surgery.

## 2021-07-09 NOTE — Anesthesia Postprocedure Evaluation (Signed)
Anesthesia Post Note  Patient: Cassidy Leach  Procedure(s) Performed: PANNICULECTOMY (Abdomen)     Patient location during evaluation: PACU Anesthesia Type: General Level of consciousness: awake and alert Pain management: pain level controlled Vital Signs Assessment: post-procedure vital signs reviewed and stable Respiratory status: spontaneous breathing, nonlabored ventilation and respiratory function stable Cardiovascular status: blood pressure returned to baseline and stable Postop Assessment: no apparent nausea or vomiting Anesthetic complications: no   No notable events documented.  Last Vitals:  Vitals:   07/09/21 1100 07/09/21 1115  BP: 117/90 (!) 120/107  Pulse: 77 76  Resp: 12 (!) 0  Temp:    SpO2: 99% 99%    Last Pain:  Vitals:   07/09/21 1045  TempSrc:   PainSc: 6                  Lowella Curb

## 2021-07-12 ENCOUNTER — Encounter (HOSPITAL_BASED_OUTPATIENT_CLINIC_OR_DEPARTMENT_OTHER): Payer: Self-pay | Admitting: Plastic Surgery

## 2021-07-12 LAB — SURGICAL PATHOLOGY

## 2021-07-15 ENCOUNTER — Other Ambulatory Visit: Payer: Self-pay

## 2021-07-15 ENCOUNTER — Ambulatory Visit (INDEPENDENT_AMBULATORY_CARE_PROVIDER_SITE_OTHER): Payer: BC Managed Care – PPO | Admitting: Plastic Surgery

## 2021-07-15 DIAGNOSIS — M793 Panniculitis, unspecified: Secondary | ICD-10-CM

## 2021-07-15 NOTE — Progress Notes (Signed)
Patient presents postop from infraumbilical panniculectomy.  She feels like she is done well with minimal pain.  On exam everything looks to be healing well.  Each drain is putting out less than 20 cc/day.  I did remove the right 1 today as that was the more tender 1.  We will plan to see her next week and likely remove the other drain.  Otherwise we will continue compressive garments and avoid strenuous activity.  All of her questions were answered.

## 2021-07-17 ENCOUNTER — Encounter: Payer: Self-pay | Admitting: Plastic Surgery

## 2021-07-22 ENCOUNTER — Other Ambulatory Visit: Payer: Self-pay

## 2021-07-22 ENCOUNTER — Ambulatory Visit (INDEPENDENT_AMBULATORY_CARE_PROVIDER_SITE_OTHER): Payer: BC Managed Care – PPO | Admitting: Surgical

## 2021-07-22 DIAGNOSIS — M793 Panniculitis, unspecified: Secondary | ICD-10-CM

## 2021-07-22 NOTE — Progress Notes (Signed)
50 year old female here for follow-up after panniculectomy with Dr. Arita Miss on 07/09/2021.  She is 2 weeks postop.  She reports overall she is doing well.  JP drain output has been approximately 15 to 20 cc per 24 hours.  She is not having any infectious symptoms.  She is overall very pleased.  Chaperone present on exam On exam abdominal incision is intact and healing well.  Some of the Steri-Strips have been removed to reveal a well-healing incision.  Left JP drain in place with serosanguineous drainage in bulb, approximately 15 cc.  No erythema or cellulitic changes.  Recommend following up in 3 to 4 weeks for reevaluation.  Recommend calling with questions or concerns.  Continue to wear compressive garments.  Vaseline gauze over left JP drain insertion site.  No signs of infection.

## 2021-07-29 ENCOUNTER — Encounter (HOSPITAL_BASED_OUTPATIENT_CLINIC_OR_DEPARTMENT_OTHER): Payer: Self-pay | Admitting: Nurse Practitioner

## 2021-08-04 ENCOUNTER — Encounter (HOSPITAL_BASED_OUTPATIENT_CLINIC_OR_DEPARTMENT_OTHER): Payer: Self-pay | Admitting: Nurse Practitioner

## 2021-08-04 NOTE — Telephone Encounter (Signed)
Contacted patient to inquire about what symptoms Cassidy Leach is having that is lending her to think Cassidy Leach may have a post surgical clot Patient states Cassidy Leach is not having any symptoms besides a lingering cough, Cassidy Leach admits to having relief with her inhaler Patient states someone mentioned a possible clot as a complication from surgery in a friends chat that Cassidy Leach is apart of and wanted to "throw it out there" Confirmed that patient is not having DOE, swelling, or loss of sensation or feeling in any limbs Patient has had post op follow up and was reported to be healing well Patient is agreeable to going to the ED if symptoms change

## 2021-08-05 ENCOUNTER — Telehealth (INDEPENDENT_AMBULATORY_CARE_PROVIDER_SITE_OTHER): Payer: Commercial Managed Care - PPO | Admitting: Nurse Practitioner

## 2021-08-05 ENCOUNTER — Other Ambulatory Visit: Payer: Self-pay

## 2021-08-05 ENCOUNTER — Encounter (HOSPITAL_BASED_OUTPATIENT_CLINIC_OR_DEPARTMENT_OTHER): Payer: Self-pay | Admitting: Nurse Practitioner

## 2021-08-05 VITALS — Ht 67.0 in | Wt 230.0 lb

## 2021-08-05 DIAGNOSIS — R052 Subacute cough: Secondary | ICD-10-CM

## 2021-08-05 MED ORDER — BENZONATATE 100 MG PO CAPS: 100.0000 mg | ORAL_CAPSULE | Freq: Three times a day (TID) | ORAL | 2 refills | Status: DC

## 2021-08-05 MED ORDER — FLUCONAZOLE 150 MG PO TABS: 150.0000 mg | ORAL_TABLET | Freq: Once | ORAL | 1 refills | Status: DC

## 2021-08-05 MED ORDER — AZITHROMYCIN 250 MG PO TABS: ORAL_TABLET | ORAL | 0 refills | Status: AC

## 2021-08-05 MED ORDER — HYDROCODONE BIT-HOMATROP MBR 5-1.5 MG/5ML PO SOLN: 5.0000 mL | Freq: Every day | ORAL | 0 refills | Status: DC

## 2021-08-05 NOTE — Patient Instructions (Signed)

## 2021-08-05 NOTE — Progress Notes (Signed)
Virtual Visit Encounter  telephone visit.   I connected with  Cassidy Leach on 08/05/21 at  3:50 PM EST by secure audio and/or video enabled telemedicine application. I verified that I am speaking with the correct person using two identifiers.   I introduced myself as a Publishing rights manager with the practice. The limitations of evaluation and management by telemedicine discussed with the patient and the availability of in person appointments. The patient expressed verbal understanding and consent to proceed.  Participating parties in this visit include: Myself and patient  The patient is: Patient Location: Home I am: Provider Location: Office/Clinic Subjective:    CC and HPI: Cassidy Leach is a 51 y.o. year old female presenting for new evaluation and treatment of cough. Sarh tells me that she has had a cough for approximately the past 2-1/2 weeks with increased mucus production.  She did recently have surgery and endorses not moving around as much as she normally does and some pain with deep breathing.  She denies shortness of breath, chest pain, impending doom, lower extremity edema, dizziness, palpitations.  She is not having fevers that she is aware of.  She reports that the cough is worse at night and she is unable to sleep well due to constant cough and congestion.  Past medical history, Surgical history, Family history not pertinant except as noted below, Social history, Allergies, and medications have been entered into the medical record, reviewed, and corrections made.   Review of Systems:  All review of systems negative except what is listed in the HPI  Objective:    Alert and oriented x 4 Speaking in clear sentences with no shortness of breath. No distress.  Impression and Recommendations:    Problem List Items Addressed This Visit   None Visit Diagnoses     Subacute cough    -  Primary   Relevant Medications   azithromycin (ZITHROMAX) 250 MG tablet   benzonatate  (TESSALON) 100 MG capsule   HYDROcodone bit-homatropine (HYCODAN) 5-1.5 MG/5ML syrup     Suspect likely bacterial or possible aspiration etiology given length of time symptoms have been present and presentation of symptoms shortly after surgery. Will err on the side of caution at this time and start treatment with azithromycin.  We will also include Tessalon Perles during the day and Hycodan for bedtime.  Discussed with patient the importance of deep breathing and coughing exercises to help remove excess fluid from the lungs.  She can continue to use over-the-counter Mucinex for symptom management as well.  No concerning signs present today for possible pulmonary embolism.  Patient strongly encouraged to follow-up on Monday if symptoms do not improve or worsen for reevaluation.  We will consider chest x-ray at that time if symptoms have not improved.  orders and follow up as documented in EMR I discussed the assessment and treatment plan with the patient. The patient was provided an opportunity to ask questions and all were answered. The patient agreed with the plan and demonstrated an understanding of the instructions.   The patient was advised to call back or seek an in-person evaluation if the symptoms worsen or if the condition fails to improve as anticipated.  Follow-Up: prn  I provided 20 minutes of non-face-to-face interaction with this non face-to-face encounter including intake, same-day documentation, and chart review.   [name], NP , DNP, AGNP-c [facility] at [facility] [phone] (fax)

## 2021-08-06 ENCOUNTER — Encounter (HOSPITAL_BASED_OUTPATIENT_CLINIC_OR_DEPARTMENT_OTHER): Payer: Self-pay | Admitting: Nurse Practitioner

## 2021-08-06 DIAGNOSIS — R052 Subacute cough: Secondary | ICD-10-CM

## 2021-08-06 MED ORDER — GUAIFENESIN-CODEINE 100-10 MG/5ML PO SYRP: 5.0000 mL | ORAL_SOLUTION | Freq: Every evening | ORAL | 0 refills | Status: DC

## 2021-08-09 ENCOUNTER — Encounter (HOSPITAL_BASED_OUTPATIENT_CLINIC_OR_DEPARTMENT_OTHER): Payer: Self-pay | Admitting: Nurse Practitioner

## 2021-08-10 ENCOUNTER — Other Ambulatory Visit (HOSPITAL_BASED_OUTPATIENT_CLINIC_OR_DEPARTMENT_OTHER): Payer: Self-pay | Admitting: Nurse Practitioner

## 2021-08-10 ENCOUNTER — Telehealth (HOSPITAL_BASED_OUTPATIENT_CLINIC_OR_DEPARTMENT_OTHER): Payer: BC Managed Care – PPO | Admitting: Nurse Practitioner

## 2021-08-10 DIAGNOSIS — R052 Subacute cough: Secondary | ICD-10-CM

## 2021-08-11 ENCOUNTER — Other Ambulatory Visit: Payer: Self-pay

## 2021-08-11 ENCOUNTER — Ambulatory Visit
Admission: RE | Admit: 2021-08-11 | Discharge: 2021-08-11 | Disposition: A | Discharge status: Home / Self Care | Payer: Commercial Managed Care - PPO | Source: Ambulatory Visit | Attending: Nurse Practitioner | Admitting: Nurse Practitioner

## 2021-08-12 ENCOUNTER — Other Ambulatory Visit (HOSPITAL_BASED_OUTPATIENT_CLINIC_OR_DEPARTMENT_OTHER): Payer: Self-pay | Admitting: Nurse Practitioner

## 2021-08-12 DIAGNOSIS — J209 Acute bronchitis, unspecified: Secondary | ICD-10-CM

## 2021-08-12 DIAGNOSIS — J4521 Mild intermittent asthma with (acute) exacerbation: Secondary | ICD-10-CM

## 2021-08-12 MED ORDER — PREDNISONE 20 MG PO TABS: 40.0000 mg | ORAL_TABLET | Freq: Every day | ORAL | 0 refills | Status: DC

## 2021-08-23 ENCOUNTER — Other Ambulatory Visit (HOSPITAL_COMMUNITY): Payer: Self-pay

## 2021-08-23 ENCOUNTER — Other Ambulatory Visit (HOSPITAL_BASED_OUTPATIENT_CLINIC_OR_DEPARTMENT_OTHER): Payer: Self-pay | Admitting: Nurse Practitioner

## 2021-08-23 ENCOUNTER — Encounter (HOSPITAL_BASED_OUTPATIENT_CLINIC_OR_DEPARTMENT_OTHER): Payer: Self-pay | Admitting: Nurse Practitioner

## 2021-08-23 DIAGNOSIS — E1169 Type 2 diabetes mellitus with other specified complication: Secondary | ICD-10-CM

## 2021-08-23 DIAGNOSIS — E559 Vitamin D deficiency, unspecified: Secondary | ICD-10-CM

## 2021-08-23 DIAGNOSIS — E1165 Type 2 diabetes mellitus with hyperglycemia: Secondary | ICD-10-CM

## 2021-08-23 DIAGNOSIS — E785 Hyperlipidemia, unspecified: Secondary | ICD-10-CM

## 2021-08-23 MED ORDER — ONETOUCH DELICA PLUS LANCET33G MISC
1.0000 | Freq: Every morning | 11 refills | Status: DC
  Filled 2021-08-23: qty 100, 90d supply, fill #0

## 2021-08-23 MED ORDER — ONETOUCH ULTRA VI STRP
ORAL_STRIP | 11 refills | Status: DC
  Filled 2021-08-23: qty 100, 90d supply, fill #0

## 2021-08-23 MED ORDER — METFORMIN HCL 500 MG PO TABS
ORAL_TABLET | ORAL | 0 refills | Status: DC
  Filled 2021-08-23: qty 270, 90d supply, fill #0

## 2021-08-24 ENCOUNTER — Other Ambulatory Visit (HOSPITAL_COMMUNITY): Payer: Self-pay

## 2021-08-24 ENCOUNTER — Encounter (HOSPITAL_COMMUNITY): Payer: Self-pay | Admitting: Pharmacist

## 2021-08-24 ENCOUNTER — Other Ambulatory Visit (HOSPITAL_BASED_OUTPATIENT_CLINIC_OR_DEPARTMENT_OTHER): Payer: Self-pay | Admitting: Nurse Practitioner

## 2021-08-24 ENCOUNTER — Other Ambulatory Visit: Payer: Self-pay

## 2021-08-24 ENCOUNTER — Ambulatory Visit: Payer: Commercial Managed Care - PPO | Admitting: Surgical

## 2021-08-24 DIAGNOSIS — E1165 Type 2 diabetes mellitus with hyperglycemia: Secondary | ICD-10-CM

## 2021-08-24 DIAGNOSIS — E1169 Type 2 diabetes mellitus with other specified complication: Secondary | ICD-10-CM

## 2021-08-24 DIAGNOSIS — M793 Panniculitis, unspecified: Secondary | ICD-10-CM

## 2021-08-24 DIAGNOSIS — Z6836 Body mass index (BMI) 36.0-36.9, adult: Secondary | ICD-10-CM

## 2021-08-24 MED ORDER — FREESTYLE LIBRE 3 SENSOR MISC: 1.0000 [IU] | 11 refills | Status: DC

## 2021-08-24 NOTE — Progress Notes (Signed)
Patient is a 51 year old female here for follow-up after panniculectomy with Dr. Arita Miss on 07/09/2021.  She reports overall she is doing well, has a lot of firmness within her lower abdomen and feels "heavy".  She is otherwise doing really great.  She is bothered by some excess tissue that she notices on the lateral aspect of her abdomen.  Chaperone present on exam On exam abdominal incision is intact and healing well.  She does have a small stitch protruding through the skin on the left lateral incision.  Otherwise the incision is healing well and there is no signs of subcutaneous fluid collections with palpation, no erythema or cellulitic changes.  Discussed ongoing postoperative care with patient, discussed she has no restrictions at this time.  She can continue wearing compressive garment to help with swelling, she can transition to no longer wearing compressive garment if she would like as well.  She can increase activity as able.  Discussed with patient that the excess tissue noted in the lateral aspect of her abdomen is tissue that was present preoperatively, we were unable to excise this tissue with direct excision and this would have required liposuction.  Given that she is doing really well, recommend following up as needed.  Recommend calling with questions or concerns.  I do not see any signs of infection on exam.  Pictures were obtained of the patient and placed in the chart with the patient's or guardian's permission.

## 2021-08-25 ENCOUNTER — Telehealth (HOSPITAL_BASED_OUTPATIENT_CLINIC_OR_DEPARTMENT_OTHER): Payer: Self-pay

## 2021-08-25 DIAGNOSIS — E785 Hyperlipidemia, unspecified: Secondary | ICD-10-CM

## 2021-08-25 DIAGNOSIS — E1169 Type 2 diabetes mellitus with other specified complication: Secondary | ICD-10-CM

## 2021-08-25 DIAGNOSIS — Z6836 Body mass index (BMI) 36.0-36.9, adult: Secondary | ICD-10-CM

## 2021-08-25 DIAGNOSIS — E1165 Type 2 diabetes mellitus with hyperglycemia: Secondary | ICD-10-CM

## 2021-08-25 MED ORDER — FREESTYLE LIBRE 3 SENSOR MISC: 1.0000 [IU] | 11 refills | Status: DC

## 2021-08-25 NOTE — Telephone Encounter (Signed)
Patient called in and ask if I could resend the script over for Libra 3 to [facility]. She called the pharmacy and they stated they lost the script.

## 2021-08-26 ENCOUNTER — Other Ambulatory Visit (HOSPITAL_COMMUNITY): Payer: Self-pay

## 2021-08-26 MED ORDER — DEXCOM G6 TRANSMITTER MISC: 4 refills | Status: DC

## 2021-08-26 MED ORDER — DEXCOM G6 SENSOR MISC: 11 refills | Status: DC

## 2021-08-26 MED ORDER — DEXCOM G6 RECEIVER DEVI: 1 refills | Status: DC

## 2021-08-27 ENCOUNTER — Encounter (HOSPITAL_BASED_OUTPATIENT_CLINIC_OR_DEPARTMENT_OTHER): Payer: Self-pay | Admitting: Nurse Practitioner

## 2021-09-01 ENCOUNTER — Other Ambulatory Visit (HOSPITAL_COMMUNITY): Payer: Self-pay

## 2021-09-02 ENCOUNTER — Encounter (HOSPITAL_BASED_OUTPATIENT_CLINIC_OR_DEPARTMENT_OTHER): Payer: Self-pay | Admitting: Nurse Practitioner

## 2021-09-02 ENCOUNTER — Other Ambulatory Visit: Payer: Self-pay

## 2021-09-02 ENCOUNTER — Telehealth (INDEPENDENT_AMBULATORY_CARE_PROVIDER_SITE_OTHER): Payer: Commercial Managed Care - PPO | Admitting: Nurse Practitioner

## 2021-09-02 DIAGNOSIS — E1165 Type 2 diabetes mellitus with hyperglycemia: Secondary | ICD-10-CM | POA: Diagnosis not present

## 2021-09-02 DIAGNOSIS — H539 Unspecified visual disturbance: Secondary | ICD-10-CM | POA: Diagnosis not present

## 2021-09-02 HISTORY — DX: Unspecified visual disturbance: H53.9

## 2021-09-02 NOTE — Assessment & Plan Note (Signed)
Increased blurry vision causing headaches.  No alarm symptoms present today. Unable to get blood pressure today as patient does not have monitoring device at home however historically blood pressures have been under good control with no concerns present. Discussion of her blood glucose readings show excellent control with the highest reading at 188 immediately after a meal.  I do not feel uncontrolled diabetes is contributing to changes in her vision at this time.  She has no alarm symptoms present. At this time recommend consulting with eye doctor to determine if these are natural changes or something we need to look into further.  Discussed with patient if the eye doctor does not feel these are related to natural or expected changes we can evaluate further with labs. She will let me know what the eye doctor says and we will make changes to plan of care as necessary based on these findings.

## 2021-09-02 NOTE — Patient Instructions (Signed)
Your blood sugar control sounds like it is EXCELLENT!! Cassidy Leach!! This is not easy to do so I am SO proud of you!  Keep up the great work.   DIABETES Excellent blood sugar goals are between 90-120 when you have not eaten.  Your blood sugars will climb after eating a meal, and this is normal, we want to make sure that it is coming back down and less than 160 2 hours after a meal.   High blood sugar can damage your organs, blood vessels, and nerves, slow wound healing, and increase your risk of infection, among many other things.  The risk of having a heart attack and/or stroke is [facility] higher if you have diabetes with uncontrolled blood sugars.  To help reduce your risks and keep you healthy, we must work together to get your blood sugar levels under control with diet, exercise, and medication.    The most important and effective way to control diabetes are through diet changes and regular exercise.   What is Happening With Diabetes? Foods high in carbohydrates (sugars, starches, bread, pasta, potatoes, soda, fruit juices, etc) break down into a sugar called glucose once in your body. Glucose is used by the cells in your body for fuel to have the energy they need to work properly.  As the glucose is released into your blood stream during digestion, your blood sugar goes up. This is called hyperglycemia.   Insulin is a hormone in the body that works as a key to unlock the cell and allow the glucose in.  Normally, insulin is released in response to rising blood glucose levels.   In people with diabetes, either the cells have changed the locks and don't open with the insulin key or there is not enough insulin made by the body to use all of the glucose in the blood.   This means that the glucose never makes it into the cells for fuel and stays in the blood. The high levels of glucose in the blood are like a poison to your organs and blood vessels and over time permanent damage starts to occur.  What  Kind of Diet is Best for Diabetes? A person with diabetes must limit the amount of carbohydrates and sugar eaten to help prevent high blood glucose levels.     You should aim for less than 1/2 of your total calorie intake per day to come from carbohydrates.  What this means is, if you eat a 1200-1500 calorie diet, you will want less than 600-750 of those calories to come from carbohydrates. That equals to about 150-200 grams of carbohydrates per day.   GOAL: Eat 1200-1500 calories a day with 150-200 grams of carbohydrates or less.   Reading labels is very important to help understand how many carbohydrates are in certain foods. There are also tables available online for restaurant foods that may help when you are eating out.   Important foods to INCREASE in your diet are lean meats, protein, and vegetables. It can be very helpful to measure the food you are eating by the recommended serving size on packages to make sure you are not overeating and monitor your calories and carbohydrates.   How Does Exercise Help with Diabetes? When you exercise, the cells in your body use up more energy. This means that they need more fuel to keep going. The cells that can still use the insulin key, take in more of the glucose from the blood for energy and the blood glucose  levels go down.   Excess fat cells can be the cause of the insulin key no longer working. Exercise and weight loss can change the locks back to allow the insulin key to work again.   Enough weight loss can sometimes get rid of diabetes!  What Kind of Exercise is Best for Diabetes? I recommend starting out with moderate exercise, like walking.  Walking every single day for at least 15-20 minutes can be enough to get you up and moving without wearing you out.  You want to walk at a pace that you can carry on a conversation without being too out of breath, but that you also get your heart rate up and break a sweat.  As you get used to daily  walking, you should increase how far, how fast, and how long you walk.    Monitoring your blood sugars helps you have an understanding of how your diet and activity levels are affecting your blood sugar. Certain foods that you think may not increase your blood sugar really make a difference. By monitoring your sugar when you eat, you can see how different foods affect your numbers. This is very important when you first start treatment to get a good understanding of what foods are good and what foods you should limit.   I would like you to monitor your blood sugar every morning before eating and write down the number to bring with you to your next visit. This will help [place] determine if we need to make changes to the medication and diet.    Medication is the key to help control your blood sugars. With diabetes, your body is not using insulin like it should to help the glucose (sugar) get into the cells for fuel. Medications help your body produce more insulin and make your cells more receptive to insulin so that the glucose in your body can be used instead of remaining in the blood.  The first line medication is called Metformin. This is a pill that you take daily, usually twice a day, to help your body properly use glucose. If good control is not achieved with metformin, other medications can be added or changed to help with better control.   Management of cholesterol is vital to reduce your risks of heart attack and stroke. Medication to control your cholesterol is also very important and necessary for your overall health.  As a new diabetic, we will plan to follow-up every 3 months to check your hemoglobin A1c, which gives me an average of your blood sugars over the past 3 months to help determine your control. Once your blood sugars are well controlled, we can go down to checking every 6 months.  We also need to closely monitor your feet, kidney function, and cholesterol as these are all affected from  diabetes.

## 2021-09-02 NOTE — Assessment & Plan Note (Signed)
Since he is doing an excellent job with management of her diabetes! Discussion with patient on expectations of a.m. fasting blood glucose less than 120 and the goal of less than 160 to 180 two hours after having a meal. Recommend if she does see higher blood glucose levels that are not coming down as she expects she can take a brisk walk and drink water to help utilize more of the blood glucose present and help dilute the blood. Recommend continuation with Dexcom and taking metformin twice a day as she has been. I do not think her vision changes are related to her blood sugar levels as these are very well controlled but I do recommend contacting the eye doctor for repeat evaluation. Unless she has new symptoms or changes we will plan to follow-up in April as scheduled.

## 2021-09-02 NOTE — Progress Notes (Signed)
Virtual Visit Encounter  telephone visit.   I connected with  Cassidy Leach on 09/02/21 at  8:10 AM EST by secure audio and/or video enabled telemedicine application. I verified that I am speaking with the correct person using two identifiers.   I introduced myself as a Publishing rights manager with the practice. The limitations of evaluation and management by telemedicine discussed with the patient and the availability of in person appointments. The patient expressed verbal understanding and consent to proceed.  Participating parties in this visit include: Myself and patient  The patient is: Patient Location: Home I am: Provider Location: Office/Clinic Subjective:    CC and HPI: Cassidy Leach is a 51 y.o. year old female presenting for concerns with blood sugar levels and vision changes/headaches.   She tells me she has been using the Dexcom to monitor her blood sugars. She tells me in the mornings she is less than 120. The highest during the day she has even seen is 188 after meals. She is taking metformin 500mg  in the morning and in the evening. She is following a low carbohydrate diet and monitoring closely. She tells me she is concerned that her blood sugars jump after meals during the day and would like to know if this is expected.  She also has concerns with vision changes and headaches. She tells me her vision feels blurry even with her glasses on. She is not having nausea, sensitivity to light/sound, dizziness. She is not checking her BP at home, but it has never been elevated in the past.   Her last diabetic eye exam was one year ago. She is coming due for her next appt.   Past medical history, Surgical history, Family history not pertinant except as noted below, Social history, Allergies, and medications have been entered into the medical record, reviewed, and corrections made.   Review of Systems:  All review of systems negative except what is listed in the HPI  Objective:     Alert and oriented x 4 Speaking in clear sentences with no shortness of breath. No distress.  Impression and Recommendations:    Problem List Items Addressed This Visit     Type 2 diabetes mellitus with hyperglycemia, without long-term current use of insulin (HCC) - Primary    Since he is doing an excellent job with management of her diabetes! Discussion with patient on expectations of a.m. fasting blood glucose less than 120 and the goal of less than 160 to 180 two hours after having a meal. Recommend if she does see higher blood glucose levels that are not coming down as she expects she can take a brisk walk and drink water to help utilize more of the blood glucose present and help dilute the blood. Recommend continuation with Dexcom and taking metformin twice a day as she has been. I do not think her vision changes are related to her blood sugar levels as these are very well controlled but I do recommend contacting the eye doctor for repeat evaluation. Unless she has new symptoms or changes we will plan to follow-up in April as scheduled.      Changes in vision    Increased blurry vision causing headaches.  No alarm symptoms present today. Unable to get blood pressure today as patient does not have monitoring device at home however historically blood pressures have been under good control with no concerns present. Discussion of her blood glucose readings show excellent control with the highest reading at 188 immediately after a meal.  I do not feel uncontrolled diabetes is contributing to changes in her vision at this time.  She has no alarm symptoms present. At this time recommend consulting with eye doctor to determine if these are natural changes or something we need to look into further.  Discussed with patient if the eye doctor does not feel these are related to natural or expected changes we can evaluate further with labs. She will let me know what the eye doctor says and we will  make changes to plan of care as necessary based on these findings.       orders and follow up as documented in EMR I discussed the assessment and treatment plan with the patient. The patient was provided an opportunity to ask questions and all were answered. The patient agreed with the plan and demonstrated an understanding of the instructions.   The patient was advised to call back or seek an in-person evaluation if the symptoms worsen or if the condition fails to improve as anticipated.  Follow-Up: As scheduled in April or sooner if needed.  I provided 20 minutes of non-face-to-face interaction with this non face-to-face encounter including intake, same-day documentation, and chart review.   [name], NP , DNP, AGNP-c [facility] Primary Care & Sports Medicine at [facility] [phone] (fax)

## 2021-09-06 ENCOUNTER — Encounter (HOSPITAL_BASED_OUTPATIENT_CLINIC_OR_DEPARTMENT_OTHER): Payer: Self-pay

## 2021-09-06 ENCOUNTER — Encounter (HOSPITAL_BASED_OUTPATIENT_CLINIC_OR_DEPARTMENT_OTHER): Payer: Self-pay | Admitting: Nurse Practitioner

## 2021-09-06 NOTE — Telephone Encounter (Signed)
I received approval for Dexcom G6 Sensor 09/06/21.

## 2021-09-09 NOTE — Telephone Encounter (Signed)
This encounter was created in error - please disregard.

## 2021-09-14 ENCOUNTER — Telehealth (HOSPITAL_BASED_OUTPATIENT_CLINIC_OR_DEPARTMENT_OTHER): Payer: Self-pay

## 2021-09-14 DIAGNOSIS — E785 Hyperlipidemia, unspecified: Secondary | ICD-10-CM

## 2021-09-14 DIAGNOSIS — E559 Vitamin D deficiency, unspecified: Secondary | ICD-10-CM

## 2021-09-14 DIAGNOSIS — E1169 Type 2 diabetes mellitus with other specified complication: Secondary | ICD-10-CM

## 2021-09-14 DIAGNOSIS — E1165 Type 2 diabetes mellitus with hyperglycemia: Secondary | ICD-10-CM

## 2021-09-14 MED ORDER — OMEPRAZOLE 40 MG PO CPDR: 40.0000 mg | DELAYED_RELEASE_CAPSULE | Freq: Once | ORAL | 2 refills | Status: DC

## 2021-09-14 MED ORDER — DEXCOM G6 RECEIVER DEVI: 1 refills | Status: DC

## 2021-09-14 MED ORDER — DEXCOM G6 SENSOR MISC: 11 refills | Status: DC

## 2021-09-14 MED ORDER — ONETOUCH DELICA PLUS LANCET33G MISC: 1.0000 | Freq: Every morning | 11 refills | Status: AC

## 2021-09-14 MED ORDER — VITAMIN D (ERGOCALCIFEROL) 1.25 MG (50000 UNIT) PO CAPS: ORAL_CAPSULE | ORAL | 0 refills | Status: DC

## 2021-09-14 MED ORDER — ONETOUCH ULTRA VI STRP: ORAL_STRIP | 11 refills | Status: DC

## 2021-09-14 MED ORDER — METFORMIN HCL 500 MG PO TABS: ORAL_TABLET | ORAL | 0 refills | Status: DC

## 2021-09-16 ENCOUNTER — Telehealth (HOSPITAL_BASED_OUTPATIENT_CLINIC_OR_DEPARTMENT_OTHER): Payer: Self-pay

## 2021-09-16 MED ORDER — OMEPRAZOLE 40 MG PO CPDR: 40.0000 mg | DELAYED_RELEASE_CAPSULE | Freq: Once | ORAL | 3 refills | Status: DC

## 2021-09-30 ENCOUNTER — Encounter (HOSPITAL_BASED_OUTPATIENT_CLINIC_OR_DEPARTMENT_OTHER): Payer: Self-pay | Admitting: Nurse Practitioner

## 2021-09-30 ENCOUNTER — Telehealth (INDEPENDENT_AMBULATORY_CARE_PROVIDER_SITE_OTHER): Payer: Commercial Managed Care - PPO | Admitting: Nurse Practitioner

## 2021-09-30 ENCOUNTER — Other Ambulatory Visit: Payer: Self-pay

## 2021-09-30 DIAGNOSIS — J4520 Mild intermittent asthma, uncomplicated: Secondary | ICD-10-CM | POA: Diagnosis not present

## 2021-09-30 DIAGNOSIS — J3089 Other allergic rhinitis: Secondary | ICD-10-CM

## 2021-09-30 MED ORDER — ALBUTEROL SULFATE HFA 108 (90 BASE) MCG/ACT IN AERS: 2.0000 | INHALATION_SPRAY | Freq: Four times a day (QID) | RESPIRATORY_TRACT | 5 refills | Status: AC | PRN

## 2021-09-30 MED ORDER — LEVOCETIRIZINE DIHYDROCHLORIDE 5 MG PO TABS: 5.0000 mg | ORAL_TABLET | Freq: Every evening | ORAL | 3 refills | Status: DC

## 2021-09-30 NOTE — Progress Notes (Signed)
Virtual Visit Encounter  telephone visit. ? ? ?I connected with  Cassidy Leach on 10/04/21 at  8:50 AM EST by secure audio and/or video enabled telemedicine application. I verified that I am speaking with the correct person using two identifiers. ?  ?I introduced myself as a Publishing rights manager with the practice. The limitations of evaluation and management by telemedicine discussed with the patient and the availability of in person appointments. The patient expressed verbal understanding and consent to proceed. ? ?Participating parties in this visit include: Myself and patient ? ?The patient is: Patient Location: Home ?I am: Provider Location: Office/Clinic ?Subjective:   ? ?CC and HPI: Cassidy Leach is a 51 y.o. year old female presenting for new evaluation and treatment of allergies. ?- she has been taking benadryl, zyrtec, Claritin, and nasal spray with little no relief.  ?- chronic history of seasonal allergies.  ?- asthma tends to exacerbate with allergies left uncontrolled ?- previous use of levocitirizine has been effective, would like to restart.  ?- no fever, chills, BA, ShOB, cough present.  ?- endorses rhinorrhea, congestion, sneezing, increased mucous production.  ? ?Past medical history, Surgical history, Family history not pertinant except as noted below, Social history, Allergies, and medications have been entered into the medical record, reviewed, and corrections made.  ? ?Review of Systems:  ?All review of systems negative except what is listed in the HPI ? ?Objective:   ? ?Alert and oriented x 4 ?Speaking in clear sentences with no shortness of breath. ?No distress. ? ?Impression and Recommendations:   ? ?Problem List Items Addressed This Visit   ? ? Asthma, mild intermittent - Primary  ?  Chronic. Worsens with seasonal allergy exacerbation.  ?No alarm sx present today.  ?Will restart levocitirizine and monitor for new or worsening sx.  ?F/U if no change or sx worsen.  ?  ?  ? Relevant  Medications  ? albuterol (VENTOLIN HFA) 108 (90 Base) MCG/ACT inhaler  ? Environmental and seasonal allergies  ?  Chronic, seasonal.  ?Will plan to restart levocitirizine and recommend f/u if sx worsen or fail to improve with treatment.  ?Monitor for asthma exacerbation.  ?  ?  ? Relevant Medications  ? levocetirizine (XYZAL) 5 MG tablet  ? albuterol (VENTOLIN HFA) 108 (90 Base) MCG/ACT inhaler  ? ? ?orders and follow up as documented in EMR ?I discussed the assessment and treatment plan with the patient. The patient was provided an opportunity to ask questions and all were answered. The patient agreed with the plan and demonstrated an understanding of the instructions. ?  ?The patient was advised to call back or seek an in-person evaluation if the symptoms worsen or if the condition fails to improve as anticipated. ? ?Follow-Up: prn ? ?I provided 14 minutes of non-face-to-face interaction with this non face-to-face encounter including intake, same-day documentation, and chart review.  ? ?[name], NP , DNP, AGNP-c ?[facility] ?Primary Care & Sports Medicine at [facility] ?[phone] ?[phone] (fax) ? ?

## 2021-10-04 DIAGNOSIS — J3089 Other allergic rhinitis: Secondary | ICD-10-CM | POA: Insufficient documentation

## 2021-10-04 NOTE — Assessment & Plan Note (Signed)
Chronic, seasonal.  ?Will plan to restart levocitirizine and recommend f/u if sx worsen or fail to improve with treatment.  ?Monitor for asthma exacerbation.  ?

## 2021-10-04 NOTE — Assessment & Plan Note (Signed)
Chronic. Worsens with seasonal allergy exacerbation.  ?No alarm sx present today.  ?Will restart levocitirizine and monitor for new or worsening sx.  ?F/U if no change or sx worsen.  ?

## 2021-10-12 ENCOUNTER — Other Ambulatory Visit: Payer: Self-pay

## 2021-10-12 ENCOUNTER — Ambulatory Visit (INDEPENDENT_AMBULATORY_CARE_PROVIDER_SITE_OTHER): Payer: Commercial Managed Care - PPO | Admitting: Nurse Practitioner

## 2021-10-12 ENCOUNTER — Encounter (HOSPITAL_BASED_OUTPATIENT_CLINIC_OR_DEPARTMENT_OTHER): Payer: Self-pay | Admitting: Nurse Practitioner

## 2021-10-12 DIAGNOSIS — J4520 Mild intermittent asthma, uncomplicated: Secondary | ICD-10-CM | POA: Diagnosis not present

## 2021-10-12 DIAGNOSIS — R052 Subacute cough: Secondary | ICD-10-CM | POA: Insufficient documentation

## 2021-10-12 HISTORY — DX: Subacute cough: R05.2

## 2021-10-12 MED ORDER — AZITHROMYCIN 250 MG PO TABS: ORAL_TABLET | ORAL | 0 refills | Status: AC

## 2021-10-12 MED ORDER — PREDNISONE 20 MG PO TABS: 20.0000 mg | ORAL_TABLET | Freq: Every day | ORAL | 0 refills | Status: DC

## 2021-10-12 MED ORDER — HYDROCODONE BIT-HOMATROP MBR 5-1.5 MG/5ML PO SOLN: 5.0000 mL | Freq: Three times a day (TID) | ORAL | 0 refills | Status: DC | PRN

## 2021-10-12 NOTE — Patient Instructions (Signed)
Mucinex can be helpful for the cough and increased mucous.  ? ?If you are not feeling better by the end of the week let me know.  ?

## 2021-10-12 NOTE — Assessment & Plan Note (Signed)
Cough in the setting of asthma that is now presenting with chills and increased mucous production. Suspect bacterial infection present given the length of time symptoms have been present.  ?Will start treatment with azithromycin. Continue albuterol. Recommend mucinex for congestion and reduced mucous production. May alternate tylenol and ibuprofen if fevers develop.  ?F/U if no improvement by weekend.  ?

## 2021-10-12 NOTE — Progress Notes (Signed)
Virtual Visit Encounter telephone visit. ? ? ?I connected with  Cassidy Leach on 10/12/21 at 11:10 AM EDT by secure audio enabled telemedicine application. I verified that I am speaking with the correct person using two identifiers. ?  ?I introduced myself as a Designer, jewellery with the practice. The limitations of evaluation and management by telemedicine discussed with the patient and the availability of in person appointments. The patient expressed verbal understanding and consent to proceed. ? ?Participating parties in this visit include: Myself and patient ? ?The patient is: Patient Location: Home ?I am: Provider Location: Office/Clinic ?Subjective:   ? ?CC and HPI: Cassidy Leach is a 51 y.o. year old female presenting for new evaluation and treatment of productive cough, fatigue, congestion, increased mucous production that has been ongoing since Cassidy Leach March.  ?-No fevers.  ?- Intermittent chills present.  ?- Cough is causing a headache and stomach muscles are hurting.  ?- She is having some wheezing.  ? ?- Has tried tylenol, cold ease, tylenol cold and flu, prescription cough medication, and tessalon Perles. Nothing is helping.  ?- Her shortness of breath and wheezing have gotten worse since yesterday.  ?- Albuterol helps but only temporarily.  ? ?- COVID test negative at home.  ? ?Past medical history, Surgical history, Family history not pertinant except as noted below, Social history, Allergies, and medications have been entered into the medical record, reviewed, and corrections made.  ? ?Review of Systems:  ?All review of systems negative except what is listed in the HPI ? ?Objective:   ? ?Alert and oriented x 4 ?Speaking in clear sentences with no shortness of breath. ?No distress. ? ?Impression and Recommendations:   ? ?Problem List Items Addressed This Visit   ? ? Asthma, mild intermittent  ?  Chronic. Recent exacerbation with suspected new infectious process contributing to recurrence of  symptoms.  ?Continue albuterol use and will add azithromycin and prednisone burst to help with management.  ?Recommend f/u if no improvement by the end of the week.  ?  ?  ? Relevant Medications  ? predniSONE (DELTASONE) 20 MG tablet  ? Subacute cough - Primary  ?  Cough in the setting of asthma that is now presenting with chills and increased mucous production. Suspect bacterial infection present given the length of time symptoms have been present.  ?Will start treatment with azithromycin. Continue albuterol. Recommend mucinex for congestion and reduced mucous production. May alternate tylenol and ibuprofen if fevers develop.  ?F/U if no improvement by weekend.  ?  ?  ? Relevant Medications  ? azithromycin (ZITHROMAX) 250 MG tablet  ? predniSONE (DELTASONE) 20 MG tablet  ? HYDROcodone bit-homatropine (HYCODAN) 5-1.5 MG/5ML syrup  ? ? ?orders and follow up as documented in EMR ?I discussed the assessment and treatment plan with the patient. The patient was provided an opportunity to ask questions and all were answered. The patient agreed with the plan and demonstrated an understanding of the instructions. ?  ?The patient was advised to call back or seek an in-person evaluation if the symptoms worsen or if the condition fails to improve as anticipated. ? ?Follow-Up: prn ? ?I provided 18 minutes of non-face-to-face interaction with this non face-to-face encounter including intake, same-day documentation, and chart review.  ? ?Orma Render, NP , DNP, AGNP-c ?[facility] ?Primary Care & Sports Medicine at [facility] ?[phone] ?[phone] (fax) ? ?

## 2021-10-12 NOTE — Assessment & Plan Note (Signed)
Chronic. Recent exacerbation with suspected new infectious process contributing to recurrence of symptoms.  ?Continue albuterol use and will add azithromycin and prednisone burst to help with management.  ?Recommend f/u if no improvement by the end of the week.  ?

## 2021-10-14 ENCOUNTER — Encounter (HOSPITAL_BASED_OUTPATIENT_CLINIC_OR_DEPARTMENT_OTHER): Payer: Self-pay

## 2021-10-14 ENCOUNTER — Telehealth (HOSPITAL_BASED_OUTPATIENT_CLINIC_OR_DEPARTMENT_OTHER): Payer: Commercial Managed Care - PPO | Admitting: Nurse Practitioner

## 2021-11-09 ENCOUNTER — Ambulatory Visit (HOSPITAL_BASED_OUTPATIENT_CLINIC_OR_DEPARTMENT_OTHER): Payer: BC Managed Care – PPO | Admitting: Nurse Practitioner

## 2021-11-15 ENCOUNTER — Encounter (HOSPITAL_BASED_OUTPATIENT_CLINIC_OR_DEPARTMENT_OTHER): Payer: Self-pay

## 2021-11-15 ENCOUNTER — Ambulatory Visit (HOSPITAL_BASED_OUTPATIENT_CLINIC_OR_DEPARTMENT_OTHER): Payer: BC Managed Care – PPO | Admitting: Nurse Practitioner

## 2021-11-16 ENCOUNTER — Encounter (HOSPITAL_BASED_OUTPATIENT_CLINIC_OR_DEPARTMENT_OTHER): Payer: Self-pay | Admitting: Nurse Practitioner

## 2021-11-18 ENCOUNTER — Encounter (HOSPITAL_BASED_OUTPATIENT_CLINIC_OR_DEPARTMENT_OTHER): Payer: Self-pay | Admitting: Nurse Practitioner

## 2021-11-18 ENCOUNTER — Ambulatory Visit (HOSPITAL_BASED_OUTPATIENT_CLINIC_OR_DEPARTMENT_OTHER): Payer: Commercial Managed Care - PPO | Admitting: Nurse Practitioner

## 2021-11-18 VITALS — BP 128/88 | HR 86 | Ht 67.0 in | Wt 232.2 lb

## 2021-11-18 DIAGNOSIS — J3089 Other allergic rhinitis: Secondary | ICD-10-CM | POA: Diagnosis not present

## 2021-11-18 DIAGNOSIS — J4520 Mild intermittent asthma, uncomplicated: Secondary | ICD-10-CM

## 2021-11-18 DIAGNOSIS — E559 Vitamin D deficiency, unspecified: Secondary | ICD-10-CM | POA: Diagnosis not present

## 2021-11-18 DIAGNOSIS — E1165 Type 2 diabetes mellitus with hyperglycemia: Secondary | ICD-10-CM

## 2021-11-18 DIAGNOSIS — Z6836 Body mass index (BMI) 36.0-36.9, adult: Secondary | ICD-10-CM

## 2021-11-18 DIAGNOSIS — E785 Hyperlipidemia, unspecified: Secondary | ICD-10-CM

## 2021-11-18 DIAGNOSIS — E1169 Type 2 diabetes mellitus with other specified complication: Secondary | ICD-10-CM | POA: Diagnosis not present

## 2021-11-18 LAB — HM DIABETES EYE EXAM

## 2021-11-18 MED ORDER — LEVOCETIRIZINE DIHYDROCHLORIDE 5 MG PO TABS: 5.0000 mg | ORAL_TABLET | Freq: Every evening | ORAL | 3 refills | Status: DC

## 2021-11-18 NOTE — Progress Notes (Addendum)
?[name], DNP, AGNP-c ?[facility] & Sports Medicine ?[address] ?[address] ?[place], [place] [address] ?[phone] Office [phone] Fax ? ?ESTABLISHED PATIENT- Chronic Health and/or Follow-Up Visit ? ?Blood pressure 128/88, pulse 86, height 5\' 7"  (1.702 m), weight 232 lb 3.2 oz (105.3 kg), SpO2 97 %. ? ?Diabetes, Hyperlipidemia, and Allergic Rhinitis  ? ? ?HPI ? ?Cassidy Leach  is a 51 y.o. year old female presenting today for evaluation and management of the following: ?DM ?BG at home between 113-120 fasting ?Feels good ?CGM has helped for control highs and lows of BG ?Managing diet very well  ?Working on increased activity ?No medication SE ?Was recently ill and her BG was not as well controlled, but this has resolved ? ?HLD ?Taking medication as prescribed ?No symptoms or SE  ? ?Allergies ?Seasonal allergy exacerbations recently with increased asthma ?Not well controlled at this time ?No s/s of infection present ?Rhinorrhea, itchy/watery eyes, cough intermittently ? ?ROS ?All ROS negative with exception of what is listed in HPI ? ?PHYSICAL EXAM ?Physical Exam ?Vitals and nursing note reviewed.  ?Constitutional:   ?   Appearance: Normal appearance.  ?HENT:  ?   Head: Normocephalic.  ?   Right Ear: Tympanic membrane normal.  ?   Left Ear: Tympanic membrane normal.  ?   Nose: Congestion and rhinorrhea present.  ?   Mouth/Throat:  ?   Mouth: Mucous membranes are moist.  ?   Pharynx: Oropharynx is clear. No posterior oropharyngeal erythema.  ?   Comments: Cobblestone appearance to posterior oropharynx with positive drainage.  ?Eyes:  ?   Extraocular Movements: Extraocular movements intact.  ?   Conjunctiva/sclera: Conjunctivae normal.  ?   Pupils: Pupils are equal, round, and reactive to light.  ?Cardiovascular:  ?   Rate and Rhythm: Normal rate and regular rhythm.  ?   Pulses: Normal pulses.  ?   Heart sounds: Normal heart sounds.  ?Pulmonary:  ?   Effort: Pulmonary effort is  normal.  ?   Breath sounds: No wheezing.  ?Abdominal:  ?   General: Bowel sounds are normal.  ?   Palpations: Abdomen is soft.  ?Musculoskeletal:     ?   General: Normal range of motion.  ?   Cervical back: Normal range of motion.  ?   Right lower leg: No edema.  ?   Left lower leg: No edema.  ?Lymphadenopathy:  ?   Cervical: No cervical adenopathy.  ?Skin: ?   General: Skin is warm and dry.  ?   Capillary Refill: Capillary refill takes less than 2 seconds.  ?Neurological:  ?   General: No focal deficit present.  ?   Mental Status: She is alert and oriented to person, place, and time.  ?Psychiatric:     ?   Mood and Affect: Mood normal.     ?   Behavior: Behavior normal.     ?   Thought Content: Thought content normal.     ?   Judgment: Judgment normal.  ? ? ?ASSESSMENT & PLAN ?Problem List Items Addressed This Visit   ? ? Asthma, mild intermittent  ?  Increased seasonal allergy symptoms have resulted in some flair of asthma symptoms recently. Currently using albuterol as needed. No alarm sx present today. Will begin treatment for allergies in effort to reduce asthma exacerbation. F/U if sx worsen or fail to improve with treatment.  ? ?  ?  ? Vitamin D deficiency - Primary  ?  Chronic.  Recently has been on biweekly dosing of 5000 units of vitamin D3 replacement as she has had difficulty getting her levels up with weekly dosing. ?We will monitor labs today. ?We will consider continuous dosing of vitamin D3 if labs remain low ? ?  ?  ? Relevant Orders  ? VITAMIN D 25 Hydroxy (Vit-D Deficiency, Fractures) (Completed)  ? Body mass index (BMI) of 36.0-36.9 in adult  ?  Insetting of diabetes, hypertension, hyperlipidemia recommend strict weight monitoring to reduce risks of cardiovascular disease and further complications. ?Recommend daily activity of walking at least 20 minutes a day and working to increase time and distance daily.  Goal walking should be minimum of 150 minutes divided throughout the week.  Recommend  dietary monitoring as described under diabetes and hyperlipidemia plan. ?We will plan to follow-up in 6 months or sooner if needed. ? ?  ?  ? Type 2 diabetes mellitus with hyperglycemia, without long-term current use of insulin (HCC)  ?  Chronic.  Excellent control with use of CGM.  No alarm symptoms present today. ?Recent illness did cause a slight exacerbation of her levels which may result in slight elevation of her hemoglobin A1c.  We will be sure to keep this in mind when reviewing. ?Recommend continuation of CGM and metformin. ?Goal fasting blood glucose less than 120.  Goal blood glucose 1 to 2 hours after meals less than 180. ?We will obtain labs today for further evaluation. ?Given excellent recent control we will plan to follow-up in 6 months or sooner if needed. ? ?  ?  ? Relevant Orders  ? CBC with Differential/Platelet (Completed)  ? Comprehensive metabolic panel (Completed)  ? Hemoglobin A1c (Completed)  ? Hyperlipidemia associated with type 2 diabetes mellitus (HCC)  ?  Chronic.  Currently taking rosuvastatin with every other day dosing.  Diabetes is well controlled at this time. ?We will obtain labs today for evaluation and monitoring. ?Recommend monitoring diet with goal of saturated fat intake to be less than 13 g daily. ?This patient continues to have good control we will plan follow-up every 6 months or sooner if needed.  We will make changes to plan of care based on findings with lab results. ? ?  ?  ? Relevant Orders  ? CBC with Differential/Platelet (Completed)  ? Comprehensive metabolic panel (Completed)  ? Lipid panel (Completed)  ? Environmental and seasonal allergies  ?  Recent exacerbation of seasonal allergies with asthma exacerbation. ?Recommend starting Xyzal on a nightly basis to improve control of allergy symptoms and help worsen incidence of asthma exacerbation.  No signs of infection today.  Lungs are clear bilaterally.  We will plan to follow-up if symptoms worsen or fail to  improve with new treatment.  Continue albuterol as needed for wheezing. ? ?  ?  ? Relevant Medications  ? levocetirizine (XYZAL) 5 MG tablet  ? ? ? ?FOLLOW-UP ?Return in about 6 months (around 05/20/2022) for DM, HTN, HLD. ? ? ?[name], DNP, AGNP-c ?11/18/2021  1:53 PM ?

## 2021-11-18 NOTE — Patient Instructions (Signed)
It was a pleasure seeing you today. I hope your time spent with [place] was pleasant and helpful. Please let us know if there is anything we can do to improve the service you receive.  ? ?Today we discussed concerns with: ? ?Type 2 diabetes mellitus with hyperglycemia, without long-term current use of insulin (HCC) ? ?Hyperlipidemia associated with type 2 diabetes mellitus (HCC) ? ?Environmental and seasonal allergies ? ?Vitamin D deficiency ? ?We will plan to get labs tomorrow morning or at your convenience when you are fasting.  ? ? ?The following orders have been placed for you today: ? ?Orders Placed This Encounter  ?Procedures  ? CBC with Differential/Platelet  ?  Order Specific Question:   Release to patient  ?  Answer:   Immediate  ? Comprehensive metabolic panel  ?  Order Specific Question:   Has the patient fasted?  ?  Answer:   Yes  ?  Order Specific Question:   Release to patient  ?  Answer:   Immediate  ? Lipid panel  ?  Order Specific Question:   Has the patient fasted?  ?  Answer:   Yes  ?  Order Specific Question:   Release to patient  ?  Answer:   Immediate  ? Hemoglobin A1c  ?  Order Specific Question:   Release to patient  ?  Answer:   Immediate  ? VITAMIN D 25 Hydroxy (Vit-D Deficiency, Fractures)  ?  Order Specific Question:   Release to patient  ?  Answer:   Immediate  ? ? ? ?Important Office Information ?Lab Results ?If labs were ordered, please note that you will see results through MyChart as soon as they come available from [facility].  ?It takes up to 5 business days for the results to be routed to me and for me to review them once all of the lab results have come through from [facility]. I will make recommendations based on your results and send these through MyChart or someone from the office will call you to discuss. If your labs are abnormal, we may contact you to schedule a visit to discuss the results and make recommendations.  ?If you have not heard from [place] within 5 business days or you have  waited longer than a week and your lab results have not come through on MyChart, please feel free to call the office or send a message through MyChart to follow-up on these labs.  ? ?Referrals ?If referrals were placed today, the office where the referral was sent will contact you either by phone or through MyChart to set up scheduling. Please note that it can take up to a week for the referral office to contact you. If you do not hear from them in a week, please contact the referral office directly to inquire about scheduling.  ? ?Condition Treated ?If your condition worsens or you begin to have new symptoms, please schedule a follow-up appointment for further evaluation. If you are not sure if an appointment is needed, you may call the office to leave a message for the nurse and someone will contact you with recommendations.  ?If you have an urgent or life threatening emergency, please do not call the office, but seek emergency evaluation by calling 911 or going to the nearest emergency room for evaluation.  ? ?MyChart and Phone Calls ?Please do not use MyChart for urgent messages. It may take up to 3 business days for MyChart messages to be read by staff and  if they are unable to handle the request, an additional 3 business days for them to be routed to me and for my response.  ?Messages sent to the provider through MyChart do not come directly to the provider, please allow time for these messages to be routed and for me to respond.  ?We get a large volume of MyChart messages daily and these are responded to in the order received.  ? ?For urgent messages, please call the office at [phone] and speak with the front office staff or leave a message on the line of my assistant for guidance.  ?We are seeing patients from the hours of 8:00 am through 5:00 pm and calls directly to the nurse may not be answered immediately due to seeing patients, but your call will be returned as soon as possible.  ?Phone  messages  received after 4:00 PM Monday through Thursday may not be returned until the following business day. Phone messages received after 11:00 AM on Friday may not be returned until Monday.  ? ?After Hours ?We share on call hours with providers from other offices. If you have an urgent need after hours that cannot wait until the next business day, please contact the on call provider by calling the office number. A nurse will speak with you and contact the provider if needed for recommendations.  ?If you have an urgent or life threatening emergency after hours, please do not call the on call provider, but seek emergency evaluation by calling 911 or going to the nearest emergency room for evaluation.  ? ?Paperwork ?All paperwork requires a minimum of 5 days to complete and return to you or the designated personnel. Please keep this in mind when bringing in forms or sending requests for paperwork completion to the office.  ?  ?

## 2021-11-22 ENCOUNTER — Ambulatory Visit (HOSPITAL_BASED_OUTPATIENT_CLINIC_OR_DEPARTMENT_OTHER): Payer: Commercial Managed Care - PPO

## 2021-11-23 LAB — CBC WITH DIFFERENTIAL/PLATELET
Basophils Absolute: 0 10*3/uL (ref 0.0–0.2)
Basos: 1 %
EOS (ABSOLUTE): 0.1 10*3/uL (ref 0.0–0.4)
Eos: 3 %
Hematocrit: 40.6 % (ref 34.0–46.6)
Hemoglobin: 13.3 g/dL (ref 11.1–15.9)
Immature Grans (Abs): 0 10*3/uL (ref 0.0–0.1)
Immature Granulocytes: 0 %
Lymphocytes Absolute: 2.4 10*3/uL (ref 0.7–3.1)
Lymphs: 59 %
MCH: 28.3 pg (ref 26.6–33.0)
MCHC: 32.8 g/dL (ref 31.5–35.7)
MCV: 86 fL (ref 79–97)
Monocytes Absolute: 0.2 10*3/uL (ref 0.1–0.9)
Monocytes: 6 %
Neutrophils Absolute: 1.2 10*3/uL — ABNORMAL LOW (ref 1.4–7.0)
Neutrophils: 31 %
Platelets: 274 10*3/uL (ref 150–450)
RBC: 4.7 x10E6/uL (ref 3.77–5.28)
RDW: 14.5 % (ref 11.7–15.4)
WBC: 4.1 10*3/uL (ref 3.4–10.8)

## 2021-11-23 LAB — COMPREHENSIVE METABOLIC PANEL
ALT: 15 IU/L (ref 0–32)
AST: 17 IU/L (ref 0–40)
Albumin/Globulin Ratio: 1.5 (ref 1.2–2.2)
Albumin: 4.4 g/dL (ref 3.8–4.9)
Alkaline Phosphatase: 58 IU/L (ref 44–121)
BUN/Creatinine Ratio: 14 (ref 9–23)
BUN: 12 mg/dL (ref 6–24)
Bilirubin Total: 0.5 mg/dL (ref 0.0–1.2)
CO2: 21 mmol/L (ref 20–29)
Calcium: 9.5 mg/dL (ref 8.7–10.2)
Chloride: 105 mmol/L (ref 96–106)
Creatinine, Ser: 0.84 mg/dL (ref 0.57–1.00)
Globulin, Total: 2.9 g/dL (ref 1.5–4.5)
Glucose: 117 mg/dL — ABNORMAL HIGH (ref 70–99)
Potassium: 4.3 mmol/L (ref 3.5–5.2)
Sodium: 142 mmol/L (ref 134–144)
Total Protein: 7.3 g/dL (ref 6.0–8.5)
eGFR: 84 mL/min/{1.73_m2} (ref >59)

## 2021-11-23 LAB — LIPID PANEL
Chol/HDL Ratio: 5.8 ratio — ABNORMAL HIGH (ref 0.0–4.4)
Cholesterol, Total: 227 mg/dL — ABNORMAL HIGH (ref 100–199)
HDL: 39 mg/dL — ABNORMAL LOW (ref >39)
LDL Chol Calc (NIH): 164 mg/dL — ABNORMAL HIGH (ref 0–99)
Triglycerides: 130 mg/dL (ref 0–149)
VLDL Cholesterol Cal: 24 mg/dL (ref 5–40)

## 2021-11-23 LAB — VITAMIN D 25 HYDROXY (VIT D DEFICIENCY, FRACTURES): Vit D, 25-Hydroxy: 19.9 ng/mL — ABNORMAL LOW (ref 30.0–100.0)

## 2021-11-23 LAB — HEMOGLOBIN A1C
Est. average glucose Bld gHb Est-mCnc: 131 mg/dL
Hgb A1c MFr Bld: 6.2 % — ABNORMAL HIGH (ref 4.8–5.6)

## 2021-11-24 ENCOUNTER — Encounter (HOSPITAL_BASED_OUTPATIENT_CLINIC_OR_DEPARTMENT_OTHER): Payer: Self-pay | Admitting: Nurse Practitioner

## 2021-11-24 ENCOUNTER — Other Ambulatory Visit (HOSPITAL_BASED_OUTPATIENT_CLINIC_OR_DEPARTMENT_OTHER): Payer: Self-pay

## 2021-11-24 DIAGNOSIS — E1169 Type 2 diabetes mellitus with other specified complication: Secondary | ICD-10-CM

## 2021-11-24 DIAGNOSIS — E1165 Type 2 diabetes mellitus with hyperglycemia: Secondary | ICD-10-CM

## 2021-11-24 MED ORDER — ROSUVASTATIN CALCIUM 20 MG PO TABS: 20.0000 mg | ORAL_TABLET | Freq: Every day | ORAL | 3 refills | Status: DC

## 2021-11-25 ENCOUNTER — Other Ambulatory Visit (HOSPITAL_BASED_OUTPATIENT_CLINIC_OR_DEPARTMENT_OTHER): Payer: Self-pay | Admitting: Nurse Practitioner

## 2021-11-25 DIAGNOSIS — E1165 Type 2 diabetes mellitus with hyperglycemia: Secondary | ICD-10-CM

## 2021-11-25 DIAGNOSIS — E785 Hyperlipidemia, unspecified: Secondary | ICD-10-CM

## 2021-11-26 ENCOUNTER — Other Ambulatory Visit: Payer: Self-pay | Admitting: Podiatry

## 2021-11-27 ENCOUNTER — Other Ambulatory Visit (HOSPITAL_BASED_OUTPATIENT_CLINIC_OR_DEPARTMENT_OTHER): Payer: Self-pay | Admitting: Nurse Practitioner

## 2021-11-28 ENCOUNTER — Other Ambulatory Visit (HOSPITAL_BASED_OUTPATIENT_CLINIC_OR_DEPARTMENT_OTHER): Payer: Self-pay | Admitting: Nurse Practitioner

## 2021-11-28 ENCOUNTER — Encounter (HOSPITAL_BASED_OUTPATIENT_CLINIC_OR_DEPARTMENT_OTHER): Payer: Self-pay | Admitting: Nurse Practitioner

## 2021-11-28 NOTE — Assessment & Plan Note (Addendum)
Chronic.  Recently has been on biweekly dosing of 5000 units of vitamin D3 replacement as she has had difficulty getting her levels up with weekly dosing. ?We will monitor labs today. ?We will consider continuous dosing of vitamin D3 if labs remain low ?

## 2021-11-28 NOTE — Assessment & Plan Note (Signed)
Chronic.  Currently taking rosuvastatin with every other day dosing.  Diabetes is well controlled at this time. ?We will obtain labs today for evaluation and monitoring. ?Recommend monitoring diet with goal of saturated fat intake to be less than 13 g daily. ?This patient continues to have good control we will plan follow-up every 6 months or sooner if needed.  We will make changes to plan of care based on findings with lab results. ?

## 2021-11-28 NOTE — Assessment & Plan Note (Signed)
Increased seasonal allergy symptoms have resulted in some flair of asthma symptoms recently. Currently using albuterol as needed. No alarm sx present today. Will begin treatment for allergies in effort to reduce asthma exacerbation. F/U if sx worsen or fail to improve with treatment.  ?

## 2021-11-28 NOTE — Assessment & Plan Note (Signed)
Insetting of diabetes, hypertension, hyperlipidemia recommend strict weight monitoring to reduce risks of cardiovascular disease and further complications. ?Recommend daily activity of walking at least 20 minutes a day and working to increase time and distance daily.  Goal walking should be minimum of 150 minutes divided throughout the week.  Recommend dietary monitoring as described under diabetes and hyperlipidemia plan. ?We will plan to follow-up in 6 months or sooner if needed. ?

## 2021-11-28 NOTE — Assessment & Plan Note (Signed)
Chronic.  Excellent control with use of CGM.  No alarm symptoms present today. ?Recent illness did cause a slight exacerbation of her levels which may result in slight elevation of her hemoglobin A1c.  We will be sure to keep this in mind when reviewing. ?Recommend continuation of CGM and metformin. ?Goal fasting blood glucose less than 120.  Goal blood glucose 1 to 2 hours after meals less than 180. ?We will obtain labs today for further evaluation. ?Given excellent recent control we will plan to follow-up in 6 months or sooner if needed. ?

## 2021-11-28 NOTE — Assessment & Plan Note (Signed)
Recent exacerbation of seasonal allergies with asthma exacerbation. ?Recommend starting Xyzal on a nightly basis to improve control of allergy symptoms and help worsen incidence of asthma exacerbation.  No signs of infection today.  Lungs are clear bilaterally.  We will plan to follow-up if symptoms worsen or fail to improve with new treatment.  Continue albuterol as needed for wheezing. ?

## 2021-11-29 ENCOUNTER — Other Ambulatory Visit (HOSPITAL_BASED_OUTPATIENT_CLINIC_OR_DEPARTMENT_OTHER): Payer: Self-pay | Admitting: Nurse Practitioner

## 2021-11-30 ENCOUNTER — Encounter (HOSPITAL_BASED_OUTPATIENT_CLINIC_OR_DEPARTMENT_OTHER): Payer: Self-pay | Admitting: Nurse Practitioner

## 2021-11-30 ENCOUNTER — Telehealth (INDEPENDENT_AMBULATORY_CARE_PROVIDER_SITE_OTHER): Payer: Commercial Managed Care - PPO | Admitting: Nurse Practitioner

## 2021-11-30 DIAGNOSIS — R1900 Intra-abdominal and pelvic swelling, mass and lump, unspecified site: Secondary | ICD-10-CM | POA: Diagnosis not present

## 2021-11-30 DIAGNOSIS — R1013 Epigastric pain: Secondary | ICD-10-CM | POA: Insufficient documentation

## 2021-11-30 DIAGNOSIS — M6208 Separation of muscle (nontraumatic), other site: Secondary | ICD-10-CM | POA: Insufficient documentation

## 2021-11-30 NOTE — Progress Notes (Signed)
Virtual Visit Encounter mychart visit. ? ? ?I connected with  Cassidy Leach on 11/30/21 at  9:50 AM EDT by secure video and audio telemedicine application. I verified that I am speaking with the correct person using two identifiers. ?  ?I introduced myself as a Publishing rights manager with the practice. The limitations of evaluation and management by telemedicine discussed with the patient and the availability of in person appointments. The patient expressed verbal understanding and consent to proceed. ? ?Participating parties in this visit include: Myself and patient ? ?The patient is: Patient Location: Home ?I am: Provider Location: Office/Clinic ?Subjective:   ? ?CC and HPI: Cassidy Leach is a 51 y.o. year old female presenting for new evaluation and treatment of abdominal pain. ?She endorses pain and tenderness to the upper middle abdomen when coughing too hard , sneezing too hard, or with increased intrathoracic pressure. She reports a painful twisting sensation in the upper abdomen and states it feels like her intestines are twisting. The pain lasts a few minutes and will eventually resolve.  ?She was told that her stomach was "hard" during paniculectomy surgery, but they did not look further into it. She endorses a bulge in the upper abdomen below the diaphragm with increased pressures. She tells me this has been going on for a long time, but seems to be getting worse. She was prompted to speak about it when a friend of hers recently underwent emergency surgery for an incarcerated hernia- her friend had the same presenting symptoms. She would like to ensure that she is avoiding this.  ?She endorses chronic constipation, but is able to have bowel movements with the help of medication, on a regular basis. She denies fevers, chills, nausea, vomiting, inability to hold down food, bulging that does not go away when pressure is released, not passing gas or stool or completely liquid stools.   ? ? ?Past medical  history, Surgical history, Family history not pertinant except as noted below, Social history, Allergies, and medications have been entered into the medical record, reviewed, and corrections made.  ? ?Review of Systems:  ?All review of systems negative except what is listed in the HPI ? ?Objective:   ? ?Alert and oriented x 4 ?Speaking in clear sentences with no shortness of breath. ?No distress. ? ?Area of tenderness indicated near the epigastric region with central location on the ventral surface.  ? ?Impression and Recommendations:   ? ?Problem List Items Addressed This Visit   ? ? Abdominal wall bulge - Primary  ?  Symptoms and descriptors provided by patient appear to correlate with epigastric herniation. Unable to visualize abdomen today due to virtual visit. No alarm symptoms present at this time. Given the patients symptoms and concerns, recommend evaluation with surgery for recommendations and plan of treatment. Will send referral today.  ?Discussed alarm symptoms that would require immediate evaluation with patient today. She expressed understanding to present for evaluation if she experiences severe pain, inability to hold food or drink down, fevers not otherwise specified, bulging that does not go back to normal when pressure is released, inability to pass stool or completely liquid stools.  ? ?  ?  ? Abdominal wall pain in epigastric region  ? ? ?orders and follow up as documented in EMR ?I discussed the assessment and treatment plan with the patient. The patient was provided an opportunity to ask questions and all were answered. The patient agreed with the plan and demonstrated an understanding of the instructions. ?  ?The  patient was advised to call back or seek an in-person evaluation if the symptoms worsen or if the condition fails to improve as anticipated. ? ?Follow-Up: prn ? ?I provided 19 minutes of non-face-to-face interaction with this non face-to-face encounter including intake, same-day  documentation, and chart review.  ? ?[name], NP , DNP, AGNP-c ?[facility] ?Primary Care & Sports Medicine at [facility] ?[phone] ?[phone] (fax) ? ?

## 2021-11-30 NOTE — Assessment & Plan Note (Signed)
Symptoms and descriptors provided by patient appear to correlate with epigastric herniation. Unable to visualize abdomen today due to virtual visit. No alarm symptoms present at this time. Given the patients symptoms and concerns, recommend evaluation with surgery for recommendations and plan of treatment. Will send referral today.  ?Discussed alarm symptoms that would require immediate evaluation with patient today. She expressed understanding to present for evaluation if she experiences severe pain, inability to hold food or drink down, fevers not otherwise specified, bulging that does not go back to normal when pressure is released, inability to pass stool or completely liquid stools.  ?

## 2021-11-30 NOTE — Patient Instructions (Signed)
I have sent the referral to general surgery for someone to contact you to schedule an appointment about the suspected epigastric hernia. You should hear from their office either by telephone or MyChart in the next week.  ? ?I have also included information on the chart for you to review about hernias.  ? ?Be sure to let us know or seek emergency care if you begin to experience any alarm symptoms such as: ?Severe pain that does not go away ?Bulging in the abdomen that doesn't go away ?Nausea, vomiting or inability to hold down food or drink ?Not passing any stool or only passing completely liquid stools ?Fevers not explained by other illness ? ? ?

## 2021-12-02 ENCOUNTER — Telehealth (HOSPITAL_BASED_OUTPATIENT_CLINIC_OR_DEPARTMENT_OTHER): Payer: Commercial Managed Care - PPO | Admitting: Nurse Practitioner

## 2021-12-02 ENCOUNTER — Ambulatory Visit: Payer: Commercial Managed Care - PPO | Admitting: Plastic Surgery

## 2021-12-02 DIAGNOSIS — M793 Panniculitis, unspecified: Secondary | ICD-10-CM | POA: Diagnosis not present

## 2021-12-02 DIAGNOSIS — Z411 Encounter for cosmetic surgery: Secondary | ICD-10-CM

## 2021-12-02 NOTE — Progress Notes (Signed)
? ?  Referring Provider ?[name], NP ?[phone] ?[address] ?[place],  [place] [address]  ? ?CC:  ?Chief Complaint  ?Patient presents with  ? Follow-up  ?   ? ?Cassidy Leach is an 51 y.o. female.  ?HPI: Patient presents about 5 months out from infraumbilical panniculectomy.  She feels like things are generally going well in terms of her healing but is bothered by fullness that is still present laterally on each side and extending into her lower back area.  Otherwise she is very happy with the front portion of the operation. ? ?Review of Systems ?General: Denies fevers and chills ? ?Physical Exam ? ?  11/18/2021  ?  1:52 PM 08/05/2021  ? 12:14 PM 07/09/2021  ?  1:55 PM  ?Vitals with BMI  ?Height 5\' 7"  5\' 7"    ?Weight 232 lbs 3 oz 230 lbs   ?BMI 36.36 36.01   ?Systolic 128  118  ?Diastolic 88  69  ?Pulse 86  90  ?  ?General:  No acute distress,  Alert and oriented, Non-Toxic, Normal speech and affect ?On examination she has a well-healed incision and a nice contour anteriorly.  She does have some symmetric fullness in the lateral aspect of the surgical site that extends into her back posteriorly. ? ?Assessment/Plan ?Patient presents with what is largely anesthetic concern after her infraumbilical panniculectomy.  I did refresh her memory on the goals of the operation which were functional in nature.  She is understanding that the lower back and lateral fullness was not really one of the objectives with her last surgery.  If she were to want to correct these areas I recommend liposuction and extension of the scar more more posteriorly to remove a bit more skin but this would need to be done with her in the prone position.  She is interested in how much this would cost we will plan to provide a quote for her.  I did describe this surgery in detail along with the risks and benefits..  All her questions were answered. ? ? ?12/02/2021, 1:59 PM  ? ? ?    ?

## 2021-12-03 ENCOUNTER — Encounter (HOSPITAL_BASED_OUTPATIENT_CLINIC_OR_DEPARTMENT_OTHER): Payer: Self-pay | Admitting: Nurse Practitioner

## 2021-12-06 NOTE — Telephone Encounter (Signed)
Sending fax over to [facility] at [phone]. ?

## 2021-12-14 ENCOUNTER — Encounter (HOSPITAL_BASED_OUTPATIENT_CLINIC_OR_DEPARTMENT_OTHER): Payer: Self-pay | Admitting: Nurse Practitioner

## 2021-12-21 ENCOUNTER — Encounter: Payer: Self-pay | Admitting: Plastic Surgery

## 2021-12-30 ENCOUNTER — Encounter (HOSPITAL_BASED_OUTPATIENT_CLINIC_OR_DEPARTMENT_OTHER): Payer: Self-pay | Admitting: Nurse Practitioner

## 2022-01-12 ENCOUNTER — Encounter (HOSPITAL_BASED_OUTPATIENT_CLINIC_OR_DEPARTMENT_OTHER): Payer: Self-pay | Admitting: Nurse Practitioner

## 2022-01-12 ENCOUNTER — Telehealth (INDEPENDENT_AMBULATORY_CARE_PROVIDER_SITE_OTHER): Payer: Commercial Managed Care - PPO | Admitting: Nurse Practitioner

## 2022-01-12 DIAGNOSIS — E785 Hyperlipidemia, unspecified: Secondary | ICD-10-CM | POA: Diagnosis not present

## 2022-01-12 DIAGNOSIS — Z6836 Body mass index (BMI) 36.0-36.9, adult: Secondary | ICD-10-CM | POA: Diagnosis not present

## 2022-01-12 DIAGNOSIS — E1165 Type 2 diabetes mellitus with hyperglycemia: Secondary | ICD-10-CM

## 2022-01-12 DIAGNOSIS — E1169 Type 2 diabetes mellitus with other specified complication: Secondary | ICD-10-CM | POA: Diagnosis not present

## 2022-01-12 MED ORDER — ONDANSETRON HCL 8 MG PO TABS: 8.0000 mg | ORAL_TABLET | Freq: Three times a day (TID) | ORAL | 2 refills | Status: DC | PRN

## 2022-01-12 MED ORDER — OZEMPIC (0.25 OR 0.5 MG/DOSE) 2 MG/3ML ~~LOC~~ SOPN: PEN_INJECTOR | SUBCUTANEOUS | 0 refills | Status: DC

## 2022-01-12 NOTE — Assessment & Plan Note (Signed)
Chronic in the setting of diabetes, hypertension, hyperlipidemia.  Increased risk of significant cardiovascular complications are present due to patient's comorbidities. I do feel that the patient would significantly benefit from changing medication to Ozempic for diabetes management from metformin.  Given that the patient is no longer tolerating metformin this is an appropriate change. We will plan to trial Ozempic along with continued diet and exercise modifications.  Patient will notify if she has any alarm symptoms present.  Plan to follow-up in 3 months or sooner if needed.

## 2022-01-12 NOTE — Progress Notes (Signed)
Virtual Visit Encounter telephone visit.   I connected with  Cassidy Leach on 01/12/22 at  9:30 AM EDT by secure audio telemedicine application. I verified that I am speaking with the correct person using two identifiers.   I introduced myself as a Publishing rights manager with the practice. The limitations of evaluation and management by telemedicine discussed with the patient and the availability of in person appointments. The patient expressed verbal understanding and consent to proceed.  Participating parties in this visit include: Myself and patient  The patient is: Patient Location: Home I am: Provider Location: Office/Clinic Subjective:    CC and HPI: Cassidy Leach is a 51 y.o. year old female presenting for follow up of diabetes. Patient reports the following: She has been on metformin for over a year for diabetes control.  She has had good control with this medication. She recently has been having increased side effects of nausea that are fairly severe in nature. She has tried changing her metformin dosing schedule to try to see if that would help if the medication was taken with or without food or different times a day, but nothing made a difference in the severity of her symptoms. She stopped the metformin for a few days and the upset stomach and nausea went away completely. When she restarted the medication, the side effects returned.  She would like to know if there are alternative options to help manage her diabetes and other chronic conditions.  She has heard of Ozempic in the news recently and would like to know if this would be an option for her.  Past medical history, Surgical history, Family history not pertinant except as noted below, Social history, Allergies, and medications have been entered into the medical record, reviewed, and corrections made.   Review of Systems:  All review of systems negative except what is listed in the HPI  Objective:    Alert and oriented x  4 Speaking in clear sentences with no shortness of breath. No distress.  Impression and Recommendations:    Problem List Items Addressed This Visit     Body mass index (BMI) of 36.0-36.9 in adult    Chronic in the setting of diabetes, hypertension, hyperlipidemia.  Increased risk of significant cardiovascular complications are present due to patient's comorbidities. I do feel that the patient would significantly benefit from changing medication to Ozempic for diabetes management from metformin.  Given that the patient is no longer tolerating metformin this is an appropriate change. We will plan to trial Ozempic along with continued diet and exercise modifications.  Patient will notify if she has any alarm symptoms present.  Plan to follow-up in 3 months or sooner if needed.      Relevant Medications   Semaglutide,0.25 or 0.5MG /DOS, (OZEMPIC, 0.25 OR 0.5 MG/DOSE,) 2 MG/3ML SOPN   Type 2 diabetes mellitus with hyperglycemia, without long-term current use of insulin (HCC) - Primary    Chronic.  Patient has began to experience the side effects of metformin with concerns for increased GI distress with medication use.  Given patient's symptoms discussed option of discontinuing medication and trialing alternative medication.  She is interested in this today.  She would like to trial Ozempic to see if she can improve her blood sugar control, lipid control, and weight and decrease her risks.  I do think this is an appropriate change.  Discussed risks and benefits as well as side effects with patient today.  She expressed understanding of all information and all questions were  answered.  Medication has been sent to the pharmacy.  Patient will bring the new prescription to the office when she picks it up for [place] to show her how to give her injections. Continue with diabetic diet and exercise. Continue with continuous glucose monitor for blood sugar monitoring. Need eye exam records and will need urine  microalbumin at next visit. Plan to follow-up in 3 months or sooner if needed.      Relevant Medications   Semaglutide,0.25 or 0.5MG /DOS, (OZEMPIC, 0.25 OR 0.5 MG/DOSE,) 2 MG/3ML SOPN   ondansetron (ZOFRAN) 8 MG tablet   Hyperlipidemia associated with type 2 diabetes mellitus (HCC)    Chronic.  Currently on rosuvastatin every other day dosing which she is tolerating well. We will plan to switch metformin to Ozempic today due to decreased tolerance of the medication, expectations are that this will help with her blood sugar and lipid control. Patient aware of side effects and monitoring.  We will plan to follow-up in 3 months or sooner if needed.      Relevant Medications   Semaglutide,0.25 or 0.5MG /DOS, (OZEMPIC, 0.25 OR 0.5 MG/DOSE,) 2 MG/3ML SOPN    orders and follow up as documented in EMR I discussed the assessment and treatment plan with the patient. The patient was provided an opportunity to ask questions and all were answered. The patient agreed with the plan and demonstrated an understanding of the instructions.   The patient was advised to call back or seek an in-person evaluation if the symptoms worsen or if the condition fails to improve as anticipated.  Follow-Up: in 3 months  I provided 26 minutes of non-face-to-face interaction with this non face-to-face encounter including intake, same-day documentation, and chart review.   [name], NP , DNP, AGNP-c [facility] Primary Care & Sports Medicine at [facility] [phone] (fax)

## 2022-01-12 NOTE — Assessment & Plan Note (Signed)
Chronic.  Currently on rosuvastatin every other day dosing which she is tolerating well. We will plan to switch metformin to Ozempic today due to decreased tolerance of the medication, expectations are that this will help with her blood sugar and lipid control. Patient aware of side effects and monitoring.  We will plan to follow-up in 3 months or sooner if needed.

## 2022-01-12 NOTE — Assessment & Plan Note (Signed)
Chronic.  Patient has began to experience the side effects of metformin with concerns for increased GI distress with medication use.  Given patient's symptoms discussed option of discontinuing medication and trialing alternative medication.  She is interested in this today.  She would like to trial Ozempic to see if she can improve her blood sugar control, lipid control, and weight and decrease her risks.  I do think this is an appropriate change.  Discussed risks and benefits as well as side effects with patient today.  She expressed understanding of all information and all questions were answered.  Medication has been sent to the pharmacy.  Patient will bring the new prescription to the office when she picks it up for [place] to show her how to give her injections. Continue with diabetic diet and exercise. Continue with continuous glucose monitor for blood sugar monitoring. Need eye exam records and will need urine microalbumin at next visit. Plan to follow-up in 3 months or sooner if needed.

## 2022-01-12 NOTE — Patient Instructions (Signed)
Please let us know if you have any questions or concerns.   I have sent the Ozempic to the [facility] mail order pharmacy for you. There is a 6 week supply in the first pen.   Tips:  Be sure you are eating protein with your meals Avoid overeating, this can make you feel sick Drink plenty of water You can take a magnesium supplement at bedtime to help with constipation if this occurs  We will follow-up in 3 months and see how you are doing.

## 2022-01-17 ENCOUNTER — Telehealth (HOSPITAL_BASED_OUTPATIENT_CLINIC_OR_DEPARTMENT_OTHER): Payer: Commercial Managed Care - PPO | Admitting: Nurse Practitioner

## 2022-01-19 ENCOUNTER — Encounter (HOSPITAL_BASED_OUTPATIENT_CLINIC_OR_DEPARTMENT_OTHER): Payer: Self-pay | Admitting: Nurse Practitioner

## 2022-01-20 ENCOUNTER — Ambulatory Visit (HOSPITAL_BASED_OUTPATIENT_CLINIC_OR_DEPARTMENT_OTHER): Payer: Commercial Managed Care - PPO | Admitting: Nurse Practitioner

## 2022-01-20 NOTE — Progress Notes (Signed)
Patient was given step by step instructions on how to use Ozempic pen. She injected herself and did very well. I spent 20 minutes with her and instructed her to call with any questions or concerns.

## 2022-02-08 ENCOUNTER — Encounter (HOSPITAL_BASED_OUTPATIENT_CLINIC_OR_DEPARTMENT_OTHER): Payer: Self-pay | Admitting: Nurse Practitioner

## 2022-02-16 ENCOUNTER — Other Ambulatory Visit (HOSPITAL_BASED_OUTPATIENT_CLINIC_OR_DEPARTMENT_OTHER): Payer: Self-pay | Admitting: Nurse Practitioner

## 2022-02-16 DIAGNOSIS — E1165 Type 2 diabetes mellitus with hyperglycemia: Secondary | ICD-10-CM

## 2022-02-16 DIAGNOSIS — Z6836 Body mass index (BMI) 36.0-36.9, adult: Secondary | ICD-10-CM

## 2022-02-16 DIAGNOSIS — J3089 Other allergic rhinitis: Secondary | ICD-10-CM

## 2022-02-16 DIAGNOSIS — E1169 Type 2 diabetes mellitus with other specified complication: Secondary | ICD-10-CM

## 2022-02-16 DIAGNOSIS — E785 Hyperlipidemia, unspecified: Secondary | ICD-10-CM

## 2022-02-16 DIAGNOSIS — E559 Vitamin D deficiency, unspecified: Secondary | ICD-10-CM

## 2022-02-16 MED ORDER — VITAMIN D (ERGOCALCIFEROL) 1.25 MG (50000 UNIT) PO CAPS: ORAL_CAPSULE | ORAL | 0 refills | Status: DC

## 2022-02-16 MED ORDER — LEVOCETIRIZINE DIHYDROCHLORIDE 5 MG PO TABS
5.0000 mg | ORAL_TABLET | Freq: Every evening | ORAL | 3 refills | Status: DC
Start: 2022-02-16 — End: 2023-10-06

## 2022-02-16 MED ORDER — OMEPRAZOLE 40 MG PO CPDR: 40.0000 mg | DELAYED_RELEASE_CAPSULE | Freq: Every day | ORAL | 3 refills | Status: DC

## 2022-02-16 MED ORDER — ROSUVASTATIN CALCIUM 20 MG PO TABS: 20.0000 mg | ORAL_TABLET | Freq: Every day | ORAL | 3 refills | Status: DC

## 2022-02-16 MED ORDER — DEXCOM G6 SENSOR MISC: 11 refills | Status: DC

## 2022-02-16 MED ORDER — OZEMPIC (0.25 OR 0.5 MG/DOSE) 2 MG/3ML ~~LOC~~ SOPN: 0.5000 mg | PEN_INJECTOR | SUBCUTANEOUS | 2 refills | Status: DC

## 2022-02-16 MED ORDER — DEXCOM G6 TRANSMITTER MISC: 4 refills | Status: DC

## 2022-02-17 ENCOUNTER — Telehealth (HOSPITAL_BASED_OUTPATIENT_CLINIC_OR_DEPARTMENT_OTHER): Payer: Self-pay | Admitting: Nurse Practitioner

## 2022-02-17 ENCOUNTER — Encounter (HOSPITAL_BASED_OUTPATIENT_CLINIC_OR_DEPARTMENT_OTHER): Payer: Self-pay | Admitting: Nurse Practitioner

## 2022-02-17 DIAGNOSIS — E1169 Type 2 diabetes mellitus with other specified complication: Secondary | ICD-10-CM

## 2022-02-17 DIAGNOSIS — E1165 Type 2 diabetes mellitus with hyperglycemia: Secondary | ICD-10-CM

## 2022-02-17 DIAGNOSIS — Z6836 Body mass index (BMI) 36.0-36.9, adult: Secondary | ICD-10-CM

## 2022-02-17 NOTE — Telephone Encounter (Signed)
Pt calling back to see if anything has been done. I let pt know again that the CMA and provider are seeing pt and when they have availability they looks threw messages. Pt showed understanding. Please call pt when available regarding this. Please advise.

## 2022-02-17 NOTE — Telephone Encounter (Signed)
Pt called and stated she had left her Ozempic Semaglutide,0.25 or 0.5MG /DOS, (OZEMPIC, 0.25 OR 0.5 MG/DOSE,) 2 MG/3ML SOPN [[id number]]  medication while she was out of town for a funeral and is needing a supply sent in to  [facility] [address] [place], [place], [place] [address]  Until her mail orders comes in.  (Sb sent that in yesterday on 7/26) She is suppose to take a does today and doesn't want to miss it. Pt is aware that her provider and CMA are out of the office and that dr. De Peru is the on call provider. Let pt know that he is seeing pts right now but when they have a moment they will reach out to her. Please advise.

## 2022-02-17 NOTE — Telephone Encounter (Signed)
Pharmacy lvm stating they are calling for CMA regarding 2 medication refills for pt. They would like a call back at [phone]. Please advise.

## 2022-02-18 MED ORDER — OZEMPIC (0.25 OR 0.5 MG/DOSE) 2 MG/3ML ~~LOC~~ SOPN: 0.5000 mg | PEN_INJECTOR | SUBCUTANEOUS | 0 refills | Status: AC

## 2022-02-22 ENCOUNTER — Ambulatory Visit
Admission: RE | Admit: 2022-02-22 | Discharge: 2022-02-22 | Disposition: A | Discharge status: Home / Self Care | Payer: Commercial Managed Care - PPO | Source: Ambulatory Visit | Attending: Nurse Practitioner | Admitting: Nurse Practitioner

## 2022-02-22 DIAGNOSIS — Z1231 Encounter for screening mammogram for malignant neoplasm of breast: Secondary | ICD-10-CM

## 2022-03-24 ENCOUNTER — Encounter: Payer: Self-pay | Admitting: Podiatry

## 2022-04-14 ENCOUNTER — Encounter (HOSPITAL_BASED_OUTPATIENT_CLINIC_OR_DEPARTMENT_OTHER): Payer: Self-pay | Admitting: Nurse Practitioner

## 2022-04-14 ENCOUNTER — Ambulatory Visit (INDEPENDENT_AMBULATORY_CARE_PROVIDER_SITE_OTHER): Payer: Commercial Managed Care - PPO | Admitting: Nurse Practitioner

## 2022-04-14 VITALS — BP 122/81 | HR 67 | Ht 67.0 in | Wt 223.0 lb

## 2022-04-14 DIAGNOSIS — W19XXXA Unspecified fall, initial encounter: Secondary | ICD-10-CM | POA: Insufficient documentation

## 2022-04-14 DIAGNOSIS — E1165 Type 2 diabetes mellitus with hyperglycemia: Secondary | ICD-10-CM | POA: Diagnosis not present

## 2022-04-14 DIAGNOSIS — E1169 Type 2 diabetes mellitus with other specified complication: Secondary | ICD-10-CM

## 2022-04-14 DIAGNOSIS — E785 Hyperlipidemia, unspecified: Secondary | ICD-10-CM

## 2022-04-14 DIAGNOSIS — N958 Other specified menopausal and perimenopausal disorders: Secondary | ICD-10-CM | POA: Insufficient documentation

## 2022-04-14 HISTORY — DX: Unspecified fall, initial encounter: W19.XXXA

## 2022-04-14 LAB — POCT GLYCOSYLATED HEMOGLOBIN (HGB A1C): Hemoglobin A1C: 5.6 % (ref 4.0–5.6)

## 2022-04-14 MED ORDER — OZEMPIC (0.25 OR 0.5 MG/DOSE) 2 MG/3ML ~~LOC~~ SOPN: 0.2500 mg | PEN_INJECTOR | SUBCUTANEOUS | 3 refills | Status: DC

## 2022-04-14 NOTE — Patient Instructions (Signed)
I am SO PROUD OF YOU!!!  You have dropped your A1c from 6.4 to 5.6%   Your are OFFICIALLY out of diabetes and pre-diabetes blood sugar ranges!  Please keep up the great work! You have really done amazing.

## 2022-04-14 NOTE — Assessment & Plan Note (Addendum)
Chronic.  She is doing quite well on Ozempic 0.25 mg once a week.  She is not having any symptoms that are concerning to her.  Intermittent mild nausea is well controlled at this time.  She is following a diabetic diet and exercising.  A1c today 5.6%!  Patient praised for her excellent efforts and results.  We will send refill for Ozempic to pharmacy.  At this time she does not wish to increase the dosage as she is doing well on the current dose.  Given that she is doing so well and her A1c is well controlled we will plan for follow-up in 6 months.  She plans to come back for fasting labs in the next couple of days.

## 2022-04-14 NOTE — Assessment & Plan Note (Signed)
She endorses a recent fall while on vacation after slipping on the floor.  She did land on her right hip and tells me that it did bruise and slowly a knot has formed.  The bruising is subsiding however the knot is still present.  At this time there are no alarm symptoms present.  This does appear to be resolving however I did discuss that the knot may stay present for several months as it heals internally.  She is able to walk without any limitations.  Encouraged her to use heat to the area and notify if she notices any new or worsening symptoms.

## 2022-04-14 NOTE — Progress Notes (Signed)
[name], DNP, AGNP-c [facility] [address] [place], [place] [address] [phone] Office [phone] Fax  ESTABLISHED PATIENT- Chronic Health and/or Follow-Up Visit  Blood pressure 122/81, pulse 67, height 5\' 7"  (1.702 m), weight 223 lb (101.2 kg), SpO2 99 %.    Cassidy Leach is a 51 y.o. year old female presenting today for evaluation and management of the following: Follow-up (Patient presents today for follow up DM and weight.)  DIABETES Current Medications: ozempic 0.25mg  once week Medication Side Effects: mild intermittent nausea while on the ozempic Taking medications as prescribed:  Yes  Glucose Monitoring: Yes   Frequency: QAM  Average Fasting BG: Blood Pressure Monitoring:  not checking  Hypoglycemic episodes:no Polydipsia/polyuria: no Visual changes: yes Chest pain: no Paresthesias: no Diabetic Diet: yes Exercise: yes Retinal Examination: Up to Date Foot Exam: Due Today Urine Microalbumin: Due Today Pneumovax: Up to Date Influenza:  will be getting with work COVID:Up to Date  Hip Pain Just came back from vacation in [place]. She tells me while she was at the resort she fell onto her left hip and now has a knot. She tells me as she walked into the room on the first day she slipped on the floor in the room and fell backwards. She reports that initially it was bruised, but since then a knot has formed. She reports it is still pretty sore in the area.   There are no diagnoses linked to this encounter.   All ROS negative with exception of what is listed above.   PHYSICAL EXAM Physical Exam Vitals and nursing note reviewed.  Constitutional:      General: She is not in acute distress.    Appearance: Normal appearance.  HENT:     Head: Normocephalic.  Eyes:     Extraocular Movements: Extraocular movements intact.     Conjunctiva/sclera: Conjunctivae normal.     Pupils: Pupils are equal,  round, and reactive to light.  Neck:     Vascular: No carotid bruit.  Cardiovascular:     Rate and Rhythm: Normal rate and regular rhythm.     Pulses: Normal pulses.     Heart sounds: Normal heart sounds. No murmur heard. Pulmonary:     Effort: Pulmonary effort is normal.     Breath sounds: Normal breath sounds. No wheezing.  Abdominal:     General: Bowel sounds are normal. There is no distension.     Palpations: Abdomen is soft.     Tenderness: There is no abdominal tenderness. There is no guarding.  Musculoskeletal:        General: Normal range of motion.     Cervical back: Normal range of motion and neck supple.     Right lower leg: No edema.     Left lower leg: No edema.  Lymphadenopathy:     Cervical: No cervical adenopathy.  Skin:    General: Skin is warm and dry.     Capillary Refill: Capillary refill takes less than 2 seconds.  Neurological:     General: No focal deficit present.     Mental Status: She is alert and oriented to person, place, and time.  Psychiatric:        Mood and Affect: Mood normal.        Behavior: Behavior normal.        Thought Content: Thought content normal.        Judgment: Judgment normal.     PLAN Problem  List Items Addressed This Visit     Type 2 diabetes mellitus with hyperglycemia, without long-term current use of insulin ([place]) - Primary    Chronic.  She is doing quite well on Ozempic 0.25 mg once a week.  She is not having any symptoms that are concerning to her.  Intermittent mild nausea is well controlled at this time.  She is following a diabetic diet and exercising.  A1c today 5.6%!  Patient praised for her excellent efforts and results.  We will send refill for Ozempic to pharmacy.  At this time she does not wish to increase the dosage as she is doing well on the current dose.  Given that she is doing so well and her A1c is well controlled we will plan for follow-up in 6 months.  She plans to come back for fasting labs in the next  couple of days.      Relevant Medications   Semaglutide,0.25 or 0.5MG /DOS, (OZEMPIC, 0.25 OR 0.5 MG/DOSE,) 2 MG/3ML SOPN   Other Relevant Orders   POCT glycosylated hemoglobin (Hb A1C) (Completed)   CBC with Differential/Platelet   Comprehensive metabolic panel   HM DIABETES FOOT EXAM (Completed)   Lipid panel   VITAMIN D 25 Hydroxy (Vit-D Deficiency, Fractures)   Urine Microalbumin w/creat. ratio   Hyperlipidemia associated with type 2 diabetes mellitus (HCC)    Chronic.  Labs ordered.  She will come back for these in the next couple of days.  Continue rosuvastatin.      Relevant Medications   Semaglutide,0.25 or 0.5MG /DOS, (OZEMPIC, 0.25 OR 0.5 MG/DOSE,) 2 MG/3ML SOPN   Other Relevant Orders   CBC with Differential/Platelet   Comprehensive metabolic panel   HM DIABETES FOOT EXAM (Completed)   Lipid panel   VITAMIN D 25 Hydroxy (Vit-D Deficiency, Fractures)   Urine Microalbumin w/creat. ratio   Fall    She endorses a recent fall while on vacation after slipping on the floor.  She did land on her right hip and tells me that it did bruise and slowly a knot has formed.  The bruising is subsiding however the knot is still present.  At this time there are no alarm symptoms present.  This does appear to be resolving however I did discuss that the knot may stay present for several months as it heals internally.  She is able to walk without any limitations.  Encouraged her to use heat to the area and notify if she notices any new or worsening symptoms.       Return in about 6 months (around 10/13/2022) for DM.   Worthy Keeler, DNP, AGNP-c 04/14/2022  1:20 PM

## 2022-04-14 NOTE — Assessment & Plan Note (Signed)
Chronic.  Labs ordered.  She will come back for these in the next couple of days.  Continue rosuvastatin.

## 2022-05-02 ENCOUNTER — Ambulatory Visit: Payer: Self-pay | Admitting: Podiatry

## 2022-05-05 ENCOUNTER — Ambulatory Visit (HOSPITAL_BASED_OUTPATIENT_CLINIC_OR_DEPARTMENT_OTHER): Payer: Commercial Managed Care - PPO

## 2022-05-06 ENCOUNTER — Other Ambulatory Visit (HOSPITAL_BASED_OUTPATIENT_CLINIC_OR_DEPARTMENT_OTHER): Payer: Self-pay | Admitting: Nurse Practitioner

## 2022-05-07 LAB — LIPID PANEL
Chol/HDL Ratio: 6.1 ratio — ABNORMAL HIGH (ref 0.0–4.4)
Cholesterol, Total: 224 mg/dL — ABNORMAL HIGH (ref 100–199)
HDL: 37 mg/dL — ABNORMAL LOW (ref >39)
LDL Chol Calc (NIH): 168 mg/dL — ABNORMAL HIGH (ref 0–99)
Triglycerides: 107 mg/dL (ref 0–149)
VLDL Cholesterol Cal: 19 mg/dL (ref 5–40)

## 2022-05-07 LAB — CBC WITH DIFFERENTIAL/PLATELET
Basophils Absolute: 0 10*3/uL (ref 0.0–0.2)
Basos: 0 %
EOS (ABSOLUTE): 0.1 10*3/uL (ref 0.0–0.4)
Eos: 3 %
Hematocrit: 43.2 % (ref 34.0–46.6)
Hemoglobin: 13.8 g/dL (ref 11.1–15.9)
Immature Grans (Abs): 0 10*3/uL (ref 0.0–0.1)
Immature Granulocytes: 0 %
Lymphocytes Absolute: 2.7 10*3/uL (ref 0.7–3.1)
Lymphs: 57 %
MCH: 28.2 pg (ref 26.6–33.0)
MCHC: 31.9 g/dL (ref 31.5–35.7)
MCV: 88 fL (ref 79–97)
Monocytes Absolute: 0.3 10*3/uL (ref 0.1–0.9)
Monocytes: 6 %
Neutrophils Absolute: 1.6 10*3/uL (ref 1.4–7.0)
Neutrophils: 34 %
Platelets: 280 10*3/uL (ref 150–450)
RBC: 4.89 x10E6/uL (ref 3.77–5.28)
RDW: 13.8 % (ref 11.7–15.4)
WBC: 4.6 10*3/uL (ref 3.4–10.8)

## 2022-05-07 LAB — VITAMIN D 25 HYDROXY (VIT D DEFICIENCY, FRACTURES): Vit D, 25-Hydroxy: 30.4 ng/mL (ref 30.0–100.0)

## 2022-05-11 ENCOUNTER — Encounter (HOSPITAL_BASED_OUTPATIENT_CLINIC_OR_DEPARTMENT_OTHER): Payer: Self-pay | Admitting: Nurse Practitioner

## 2022-05-20 ENCOUNTER — Ambulatory Visit (HOSPITAL_BASED_OUTPATIENT_CLINIC_OR_DEPARTMENT_OTHER): Payer: Commercial Managed Care - PPO | Admitting: Nurse Practitioner

## 2022-06-17 ENCOUNTER — Encounter: Payer: Self-pay | Admitting: Nurse Practitioner

## 2022-06-24 ENCOUNTER — Ambulatory Visit (INDEPENDENT_AMBULATORY_CARE_PROVIDER_SITE_OTHER): Payer: Commercial Managed Care - PPO | Admitting: Nurse Practitioner

## 2022-06-24 ENCOUNTER — Encounter: Payer: Self-pay | Admitting: Nurse Practitioner

## 2022-06-24 VITALS — BP 118/78 | HR 80 | Temp 98.1°F | Wt 221.4 lb

## 2022-06-24 DIAGNOSIS — R1084 Generalized abdominal pain: Secondary | ICD-10-CM | POA: Diagnosis not present

## 2022-06-24 HISTORY — DX: Generalized abdominal pain: R10.84

## 2022-06-24 LAB — POCT URINALYSIS DIP (CLINITEK)
Bilirubin, UA: NEGATIVE
Blood, UA: NEGATIVE
Glucose, UA: NEGATIVE mg/dL
Ketones, POC UA: NEGATIVE mg/dL
Nitrite, UA: NEGATIVE
POC PROTEIN,UA: NEGATIVE
Spec Grav, UA: 1.02 (ref 1.010–1.025)
Urobilinogen, UA: 1 E.U./dL
pH, UA: 7 (ref 5.0–8.0)

## 2022-06-24 NOTE — Progress Notes (Signed)
[name], DNP, AGNP-c [facility] [address] [place], [place] [address] [phone]  Subjective:   Cassidy Leach is a 51 y.o. female presents to day for evaluation of: Stomach Pain Lower abdominal pain that comes and goes. She tells me it feels like her "stomach is going to fall out" from heaviness. She tells me if she increases her intraabdominal pressure it does seem to be worse. She reports that her abdomen is tender throughout to the touch and it seems to be worse on the right side, but not particularly at the umbilicus or RLQ. This has been present on and off for some time. She has no vaginal bleeding, she has had a hysterectomy, no nausea, vomiting, diarrhea, constipation, urinary symptoms, fevers, or chills. She has no risk of STI.   PMH, Medications, and Allergies reviewed and updated in chart as appropriate.   ROS negative except for what is listed in HPI. Objective:  BP 118/78   Pulse 80   Temp 98.1 F (36.7 C)   Wt 221 lb 6.4 oz (100.4 kg)   BMI 34.68 kg/m  Physical Exam Vitals and nursing note reviewed.  Constitutional:      Appearance: Normal appearance.  HENT:     Head: Normocephalic.  Cardiovascular:     Rate and Rhythm: Normal rate and regular rhythm.     Pulses: Normal pulses.     Heart sounds: Normal heart sounds.  Pulmonary:     Effort: Pulmonary effort is normal.     Breath sounds: Normal breath sounds.  Abdominal:     General: There is no distension.     Palpations: Abdomen is soft. There is no mass.     Tenderness: There is abdominal tenderness. There is guarding. There is no right CVA tenderness, left CVA tenderness or rebound.     Hernia: No hernia is present.     Comments: Generalized tenderness noted with light palpation. Symptoms appear to be worse on the right side in both the upper and lower quadrants, however, there is tenderness present on the left as well. No discoloration. BS present and quiet.   Skin:     General: Skin is warm and dry.     Capillary Refill: Capillary refill takes less than 2 seconds.  Neurological:     Mental Status: She is alert.  Psychiatric:        Mood and Affect: Mood normal.        Behavior: Behavior normal.        Thought Content: Thought content normal.        Judgment: Judgment normal.           Assessment & Plan:   Problem List Items Addressed This Visit     Generalized abdominal pain - Primary    Abdominal pain and tenderness, most notably for the last two weeks. Tender to palpation with reported intermittent heaviness and cramping sensation in the Lower quadrants with no bowel changes. At this time she does appear to be significantly tender on the right side with mild tenderness on the left. UA today showed no signs of blood. Consider possible diverticula, pancreas, appendix, gallbladder, liver as causative factor. Unlikely this is infectious as she is not having symptoms consistent but will obtain labs to rule this out. No alarm symptoms at this time. We will send for ultrasound of the abdomen to see if we can determine the etiology. For now recommend monitoring closely and reporting any signs of nausea, vomiting, fever,  diarrhea, chills, or severe pain- if any severe symptoms occur recommend immediate follow-up.       Relevant Orders   US Abdomen Complete   Comprehensive metabolic panel   Lipase   CBC with Differential/Platelet   POCT URINALYSIS DIP (CLINITEK) (Completed)      [name], DNP, AGNP-c 06/24/2022  9:14 AM    History, Medications, Surgery, SDOH, and Family History reviewed and updated as appropriate.

## 2022-06-24 NOTE — Patient Instructions (Addendum)
I am not sure what is going on right now, but I want to make sure there isn't any sign of infection or inflammation in your intestines, liver, or gallbladder. I have ordered a lab to look at your pancrease and liver/gallbladder and an ultrasound that will look at your entire belly.   The ultrasound is ordered for [address] at [facility].

## 2022-06-24 NOTE — Assessment & Plan Note (Signed)
Abdominal pain and tenderness, most notably for the last two weeks. Tender to palpation with reported intermittent heaviness and cramping sensation in the Lower quadrants with no bowel changes. At this time she does appear to be significantly tender on the right side with mild tenderness on the left. UA today showed no signs of blood. Consider possible diverticula, pancreas, appendix, gallbladder, liver as causative factor. Unlikely this is infectious as she is not having symptoms consistent but will obtain labs to rule this out. No alarm symptoms at this time. We will send for ultrasound of the abdomen to see if we can determine the etiology. For now recommend monitoring closely and reporting any signs of nausea, vomiting, fever, diarrhea, chills, or severe pain- if any severe symptoms occur recommend immediate follow-up.

## 2022-06-25 LAB — CBC WITH DIFFERENTIAL/PLATELET
Basophils Absolute: 0 10*3/uL (ref 0.0–0.2)
Basos: 1 %
EOS (ABSOLUTE): 0.1 10*3/uL (ref 0.0–0.4)
Eos: 3 %
Hematocrit: 40.7 % (ref 34.0–46.6)
Hemoglobin: 13.2 g/dL (ref 11.1–15.9)
Immature Grans (Abs): 0 10*3/uL (ref 0.0–0.1)
Immature Granulocytes: 0 %
Lymphocytes Absolute: 2.3 10*3/uL (ref 0.7–3.1)
Lymphs: 55 %
MCH: 28.7 pg (ref 26.6–33.0)
MCHC: 32.4 g/dL (ref 31.5–35.7)
MCV: 89 fL (ref 79–97)
Monocytes Absolute: 0.3 10*3/uL (ref 0.1–0.9)
Monocytes: 7 %
Neutrophils Absolute: 1.4 10*3/uL (ref 1.4–7.0)
Neutrophils: 34 %
Platelets: 274 10*3/uL (ref 150–450)
RBC: 4.6 x10E6/uL (ref 3.77–5.28)
RDW: 13.1 % (ref 11.7–15.4)
WBC: 4.1 10*3/uL (ref 3.4–10.8)

## 2022-06-25 LAB — COMPREHENSIVE METABOLIC PANEL
ALT: 11 IU/L (ref 0–32)
AST: 16 IU/L (ref 0–40)
Albumin/Globulin Ratio: 1.9 (ref 1.2–2.2)
Albumin: 4.7 g/dL (ref 3.8–4.9)
Alkaline Phosphatase: 58 IU/L (ref 44–121)
BUN/Creatinine Ratio: 12 (ref 9–23)
BUN: 10 mg/dL (ref 6–24)
Bilirubin Total: 0.4 mg/dL (ref 0.0–1.2)
CO2: 21 mmol/L (ref 20–29)
Calcium: 9.4 mg/dL (ref 8.7–10.2)
Chloride: 103 mmol/L (ref 96–106)
Creatinine, Ser: 0.82 mg/dL (ref 0.57–1.00)
Globulin, Total: 2.5 g/dL (ref 1.5–4.5)
Glucose: 98 mg/dL (ref 70–99)
Potassium: 4.2 mmol/L (ref 3.5–5.2)
Sodium: 141 mmol/L (ref 134–144)
Total Protein: 7.2 g/dL (ref 6.0–8.5)
eGFR: 86 mL/min/{1.73_m2} (ref >59)

## 2022-06-25 LAB — LIPASE: Lipase: 34 U/L (ref 14–72)

## 2022-06-27 ENCOUNTER — Encounter: Payer: Self-pay | Admitting: Nurse Practitioner

## 2022-06-27 ENCOUNTER — Other Ambulatory Visit: Payer: Self-pay | Admitting: Nurse Practitioner

## 2022-06-27 DIAGNOSIS — R109 Unspecified abdominal pain: Secondary | ICD-10-CM

## 2022-06-27 MED ORDER — DICYCLOMINE HCL 10 MG PO CAPS: 10.0000 mg | ORAL_CAPSULE | Freq: Three times a day (TID) | ORAL | 3 refills | Status: DC

## 2022-07-08 ENCOUNTER — Encounter: Payer: Self-pay | Admitting: Nurse Practitioner

## 2022-07-08 ENCOUNTER — Ambulatory Visit
Admission: RE | Admit: 2022-07-08 | Discharge: 2022-07-08 | Disposition: A | Discharge status: Home / Self Care | Payer: Commercial Managed Care - PPO | Source: Ambulatory Visit | Attending: Nurse Practitioner | Admitting: Nurse Practitioner

## 2022-07-08 DIAGNOSIS — R1084 Generalized abdominal pain: Secondary | ICD-10-CM

## 2022-07-15 ENCOUNTER — Other Ambulatory Visit: Payer: Self-pay

## 2022-07-15 ENCOUNTER — Encounter: Payer: Self-pay | Admitting: Nurse Practitioner

## 2022-07-15 DIAGNOSIS — R1084 Generalized abdominal pain: Secondary | ICD-10-CM

## 2022-07-15 DIAGNOSIS — R109 Unspecified abdominal pain: Secondary | ICD-10-CM

## 2022-07-21 ENCOUNTER — Encounter: Payer: Self-pay | Admitting: Internal Medicine

## 2022-08-11 ENCOUNTER — Encounter: Payer: Self-pay | Admitting: Nurse Practitioner

## 2022-08-11 ENCOUNTER — Ambulatory Visit: Payer: Commercial Managed Care - PPO | Admitting: Nurse Practitioner

## 2022-08-11 VITALS — BP 120/78 | HR 103 | Wt 220.2 lb

## 2022-08-11 DIAGNOSIS — T280XXA Burn of mouth and pharynx, initial encounter: Secondary | ICD-10-CM | POA: Diagnosis not present

## 2022-08-11 MED ORDER — NYSTATIN 100000 UNIT/ML MT SUSP: 5.0000 mL | Freq: Four times a day (QID) | OROMUCOSAL | 0 refills | Status: DC | PRN

## 2022-08-11 NOTE — Patient Instructions (Signed)
Use the mouthwash 4 times a day as needed to help with pain and irritation.   Another thing you can try biotene mouthwash to see if helps keep it moist.   Gargle in salt water at least once a day.

## 2022-08-11 NOTE — Progress Notes (Signed)
  Orma Render, DNP, AGNP-c [facility] [address] [place], [place] [address] [phone]  Subjective:   Cassidy Leach is a 52 y.o. female presents to day for evaluation of: Scald to throat/mouth Kambree reports a burn injury to her mouth and throat after accidentally drinking tea that was too hot yesterday. She tells me her tongue and throat are very sore and it hurts to swallow. It feels as though there is mucous stuck in her throat. She is not running fevers and is able to swallow oral secretions. No interference with respirations.   PMH, Medications, and Allergies reviewed and updated in chart as appropriate.   ROS negative except for what is listed in HPI. Objective:  BP 120/78   Pulse (!) 103   Wt 220 lb 3.2 oz (99.9 kg)   SpO2 99%   BMI 34.49 kg/m  Physical Exam Vitals and nursing note reviewed.  Constitutional:      Appearance: Normal appearance.  HENT:     Head: Normocephalic and atraumatic.     Nose: Nose normal.     Mouth/Throat:     Mouth: Mucous membranes are moist.     Pharynx: Uvula midline. Posterior oropharyngeal erythema present.     Comments: Erythema present to oral mucosa. No blistering visible.  Eyes:     Extraocular Movements: Extraocular movements intact.     Pupils: Pupils are equal, round, and reactive to light.  Cardiovascular:     Rate and Rhythm: Normal rate and regular rhythm.     Pulses: Normal pulses.  Pulmonary:     Effort: Pulmonary effort is normal.     Breath sounds: Normal breath sounds.  Musculoskeletal:     Cervical back: Normal range of motion.  Lymphadenopathy:     Cervical: No cervical adenopathy.  Neurological:     Mental Status: She is alert.           Assessment & Plan:   Problem List Items Addressed This Visit     Burn of throat - Primary    Scald burn to oral mucosa and throat following ingestion of hot tea by accident. No signs of infection at this time. Given her history of diabetes,  concern for secondary infection is present. Recommend magic mouthwash to help with inflammation and prevention of thrush, cool salt water gargles, and mild toothpaste until healed. No respiratory compromise present at this time. Able to swallow oral secretions without issue. She will follow-up if symptoms worsen or fail to improve.       Relevant Medications   magic mouthwash (nystatin, hydrocortisone, diphenhydrAMINE) suspension      Orma Render, DNP, AGNP-c 08/20/2022  11:48 PM    History, Medications, Surgery, SDOH, and Family History reviewed and updated as appropriate.

## 2022-08-13 ENCOUNTER — Encounter: Payer: Self-pay | Admitting: Nurse Practitioner

## 2022-08-13 MED ORDER — NYSTATIN 100000 UNIT/ML MT SUSP: 2.0000 mL | Freq: Four times a day (QID) | OROMUCOSAL | 0 refills | Status: DC

## 2022-08-13 MED ORDER — HYDROCORTISONE 2 MG/ML ORAL SUSPENSION: 4.0000 mg | Freq: Four times a day (QID) | ORAL | 0 refills | Status: DC

## 2022-08-20 DIAGNOSIS — T280XXA Burn of mouth and pharynx, initial encounter: Secondary | ICD-10-CM | POA: Insufficient documentation

## 2022-08-20 HISTORY — DX: Burn of mouth and pharynx, initial encounter: T28.0XXA

## 2022-08-20 NOTE — Assessment & Plan Note (Signed)
Scald burn to oral mucosa and throat following ingestion of hot tea by accident. No signs of infection at this time. Given her history of diabetes, concern for secondary infection is present. Recommend magic mouthwash to help with inflammation and prevention of thrush, cool salt water gargles, and mild toothpaste until healed. No respiratory compromise present at this time. Able to swallow oral secretions without issue. She will follow-up if symptoms worsen or fail to improve.

## 2022-09-13 ENCOUNTER — Encounter: Payer: Self-pay | Admitting: Gastroenterology

## 2022-09-19 ENCOUNTER — Encounter: Payer: Self-pay | Admitting: Nurse Practitioner

## 2022-09-22 ENCOUNTER — Encounter: Payer: Self-pay | Admitting: Nurse Practitioner

## 2022-09-22 ENCOUNTER — Telehealth: Payer: Commercial Managed Care - PPO | Admitting: Nurse Practitioner

## 2022-09-22 VITALS — Ht 67.0 in | Wt 220.0 lb

## 2022-09-22 DIAGNOSIS — B9689 Other specified bacterial agents as the cause of diseases classified elsewhere: Secondary | ICD-10-CM

## 2022-09-22 DIAGNOSIS — J069 Acute upper respiratory infection, unspecified: Secondary | ICD-10-CM

## 2022-09-22 DIAGNOSIS — E1165 Type 2 diabetes mellitus with hyperglycemia: Secondary | ICD-10-CM | POA: Diagnosis not present

## 2022-09-22 HISTORY — DX: Other specified bacterial agents as the cause of diseases classified elsewhere: J06.9

## 2022-09-22 HISTORY — DX: Other specified bacterial agents as the cause of diseases classified elsewhere: B96.89

## 2022-09-22 MED ORDER — BLOOD GLUCOSE TEST VI STRP: 1.0000 | ORAL_STRIP | Freq: Three times a day (TID) | 99 refills | Status: AC

## 2022-09-22 MED ORDER — PREDNISONE 20 MG PO TABS: 20.0000 mg | ORAL_TABLET | Freq: Every day | ORAL | 1 refills | Status: DC

## 2022-09-22 MED ORDER — AZITHROMYCIN 250 MG PO TABS: ORAL_TABLET | ORAL | 0 refills | Status: AC

## 2022-09-22 MED ORDER — LANCET DEVICE MISC: 1.0000 | Freq: Three times a day (TID) | 0 refills | Status: AC

## 2022-09-22 MED ORDER — LANCETS MISC. MISC: 1.0000 | Freq: Three times a day (TID) | 99 refills | Status: AC

## 2022-09-22 MED ORDER — BLOOD GLUCOSE MONITORING SUPPL DEVI: 1.0000 | Freq: Three times a day (TID) | 0 refills | Status: AC

## 2022-09-22 NOTE — Assessment & Plan Note (Signed)
She is in need of replacement glucometer and supplies. Plan: - Prescription sent to pharmacy

## 2022-09-22 NOTE — Assessment & Plan Note (Signed)
Cough, congestion, sinus p/p, and wheezing for over a week. At this point, symptoms are likely bacterial in nature. We discussed the option of a steroid burst and azithromycin. She would like to wait to start the azithromycin, but would like to have this on hand in the event the symptoms get worse over the weekend, which is reasonable.  Plan: -Continue with inhaler as needed - Start prednisone burst, may start with '20mg'$  and then split into '10mg'$  if the '20mg'$  causes blood sugars to spike too high.  - Will send azithromycin to pharmacy so she has this in the event the symptoms do not continue to improve or worsen over the weekend.

## 2022-09-22 NOTE — Progress Notes (Signed)
Virtual Visit Encounter mychart visit.   I connected with  Cassidy Leach on 09/22/22 at  8:15 AM EST by secure video and audio telemedicine application. I verified that I am speaking with the correct person using two identifiers.   I introduced myself as a Designer, jewellery with the practice. The limitations of evaluation and management by telemedicine discussed with the patient and the availability of in person appointments. The patient expressed verbal understanding and consent to proceed.  Participating parties in this visit include: Myself and patient  The patient is: Patient Location: Home I am: Provider Location: Office/Clinic Subjective:    CC and HPI: Cassidy Leach is a 52 y.o. year old female presenting for new evaluation and treatment of cough and sinus drainage. Patient reports the following:  Cassidy Leach has been experiencing sinus pain and pressure, cough, shortness of breath, congestion, and headaches for about a week. She tells me that the shortness of breath is worse at bedtime, but she has been using her inhaler to help control this. She does feel that it is improving at this time, but the cough and wheezing are getting a little worse. In the past she has had to have a prednisone burst to help get over the cough and wheezing.  She is not having fevers right now, body aches, or chills.   She also tells me that she is in need of a new glucometer and supplies. She went out of town and left her supplies and is unable to get these back.   Past medical history, Surgical history, Family history not pertinant except as noted below, Social history, Allergies, and medications have been entered into the medical record, reviewed, and corrections made.   Review of Systems:  All review of systems negative except what is listed in the HPI  Objective:    Alert and oriented x 4 Congestion and cough process  Speaking in clear sentences with no shortness of breath. No  distress.  Impression and Recommendations:    Problem List Items Addressed This Visit     Type 2 diabetes mellitus with hyperglycemia, without long-term current use of insulin ([place])    She is in need of replacement glucometer and supplies. Plan: - Prescription sent to pharmacy      Relevant Medications   Blood Glucose Monitoring Suppl DEVI   Lancet Device MISC   Lancets Misc. MISC   Glucose Blood (BLOOD GLUCOSE TEST STRIPS) STRP   Bacterial URI - Primary    Cough, congestion, sinus p/p, and wheezing for over a week. At this point, symptoms are likely bacterial in nature. We discussed the option of a steroid burst and azithromycin. She would like to wait to start the azithromycin, but would like to have this on hand in the event the symptoms get worse over the weekend, which is reasonable.  Plan: -Continue with inhaler as needed - Start prednisone burst, may start with '20mg'$  and then split into '10mg'$  if the '20mg'$  causes blood sugars to spike too high.  - Will send azithromycin to pharmacy so she has this in the event the symptoms do not continue to improve or worsen over the weekend.       Relevant Medications   predniSONE (DELTASONE) 20 MG tablet   azithromycin (ZITHROMAX) 250 MG tablet    orders and follow up as documented in EMR I discussed the assessment and treatment plan with the patient. The patient was provided an opportunity to ask questions and all were answered. The  patient agreed with the plan and demonstrated an understanding of the instructions.   The patient was advised to call back or seek an in-person evaluation if the symptoms worsen or if the condition fails to improve as anticipated.  Follow-Up: prn  I provided 16 minutes of non-face-to-face interaction with this non face-to-face encounter including intake, same-day documentation, and chart review.   Orma Render, NP , DNP, AGNP-c [facility]

## 2022-10-09 IMAGING — CR DG CHEST 2V
3 series · 3 of 3 positions shown · non-contrast
Comparison: None.

CLINICAL DATA: Cough for 4 weeks.

EXAM:
CHEST - 2 VIEW

[w chest pa (1 of 2)]
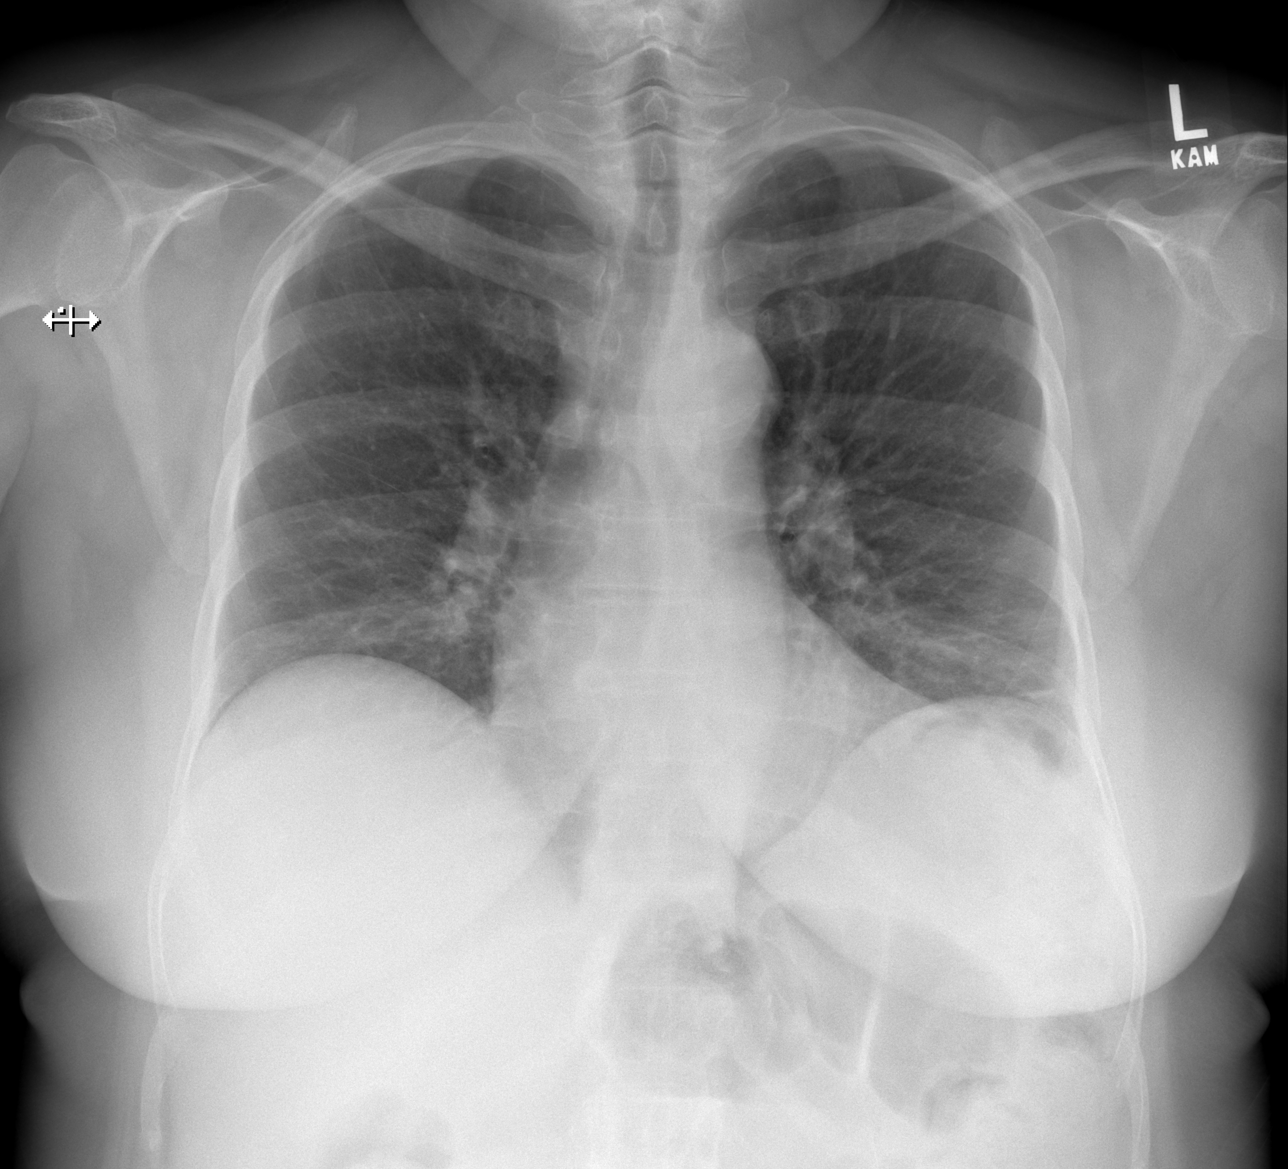

[w chest lat]
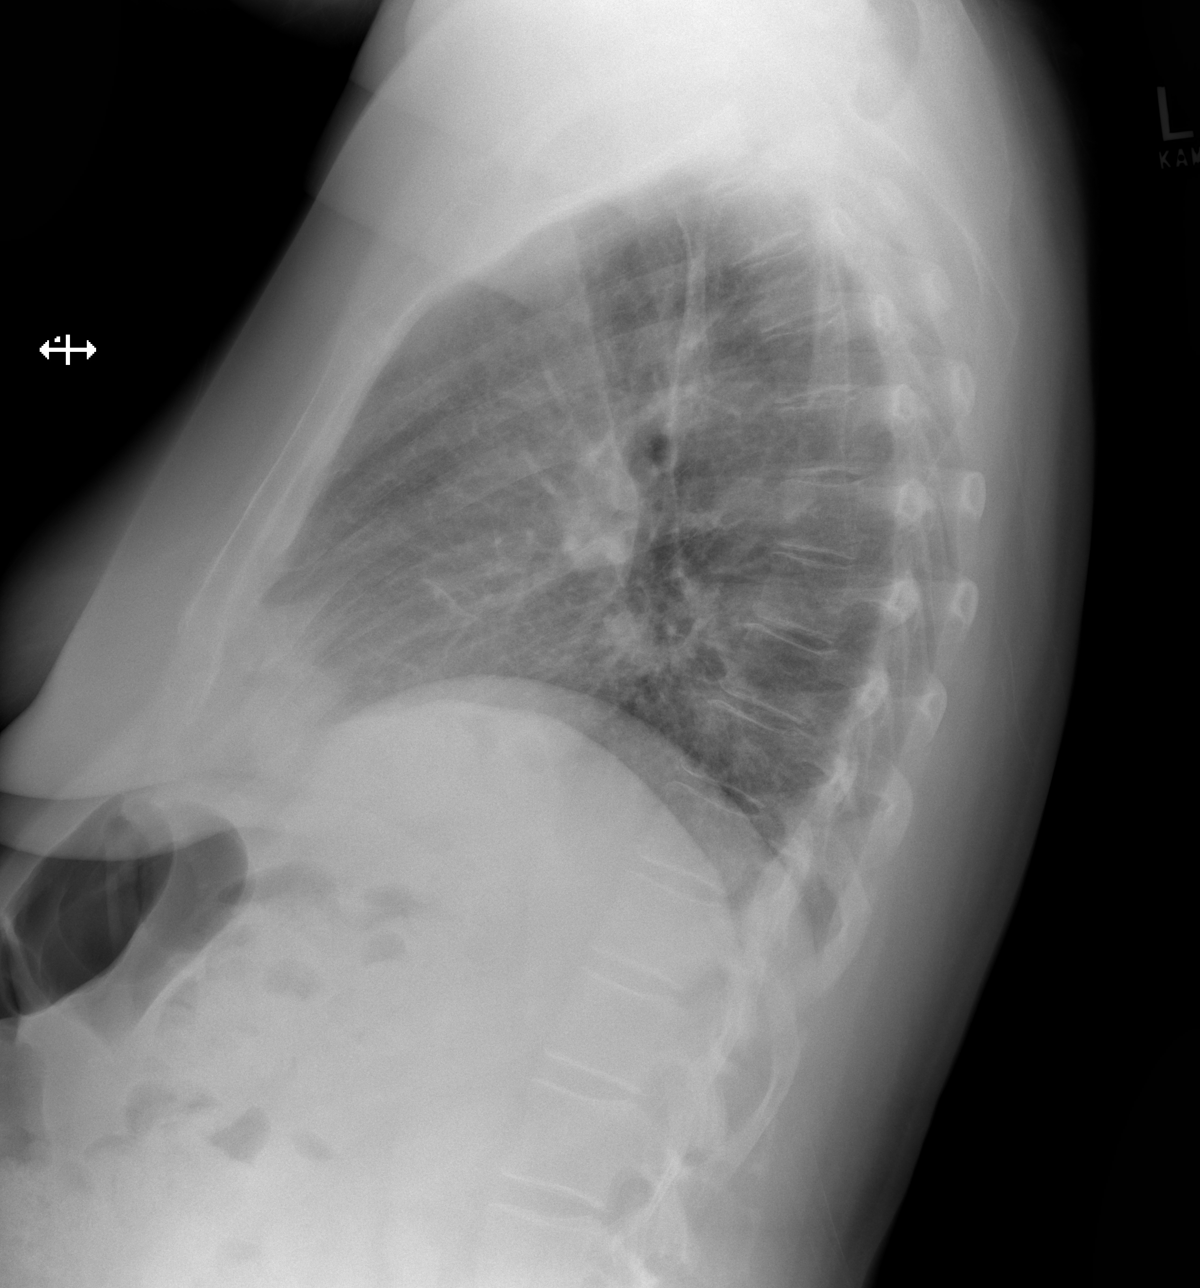

[w chest pa (2 of 2)]
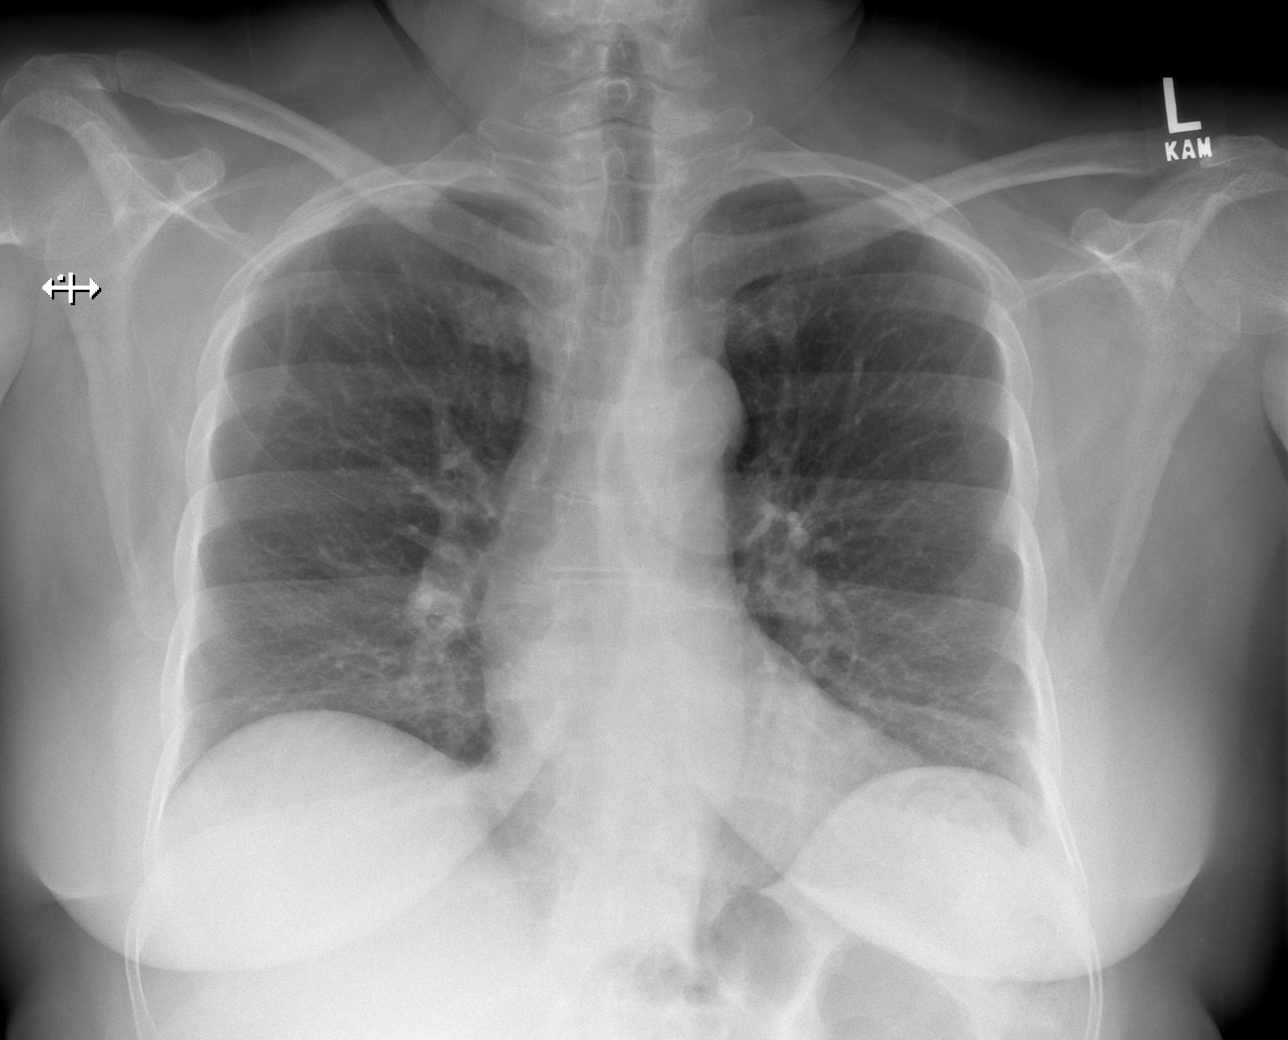

[3 of 3 positions shown; findings below may reference images not displayed]

FINDINGS: Cardiac silhouette and mediastinal contours are within normal
limits. Mild calcification within the aortic arch. The lungs are
clear. No pleural effusion or pneumothorax. Mild multilevel
degenerative disc changes of the thoracic spine.
IMPRESSION: No acute cardiopulmonary disease process.

## 2022-10-13 ENCOUNTER — Ambulatory Visit (HOSPITAL_BASED_OUTPATIENT_CLINIC_OR_DEPARTMENT_OTHER): Payer: Commercial Managed Care - PPO | Admitting: Nurse Practitioner

## 2022-10-18 ENCOUNTER — Telehealth: Payer: Self-pay | Admitting: Gastroenterology

## 2022-10-18 ENCOUNTER — Ambulatory Visit: Payer: Commercial Managed Care - PPO | Admitting: Gastroenterology

## 2022-10-18 NOTE — Telephone Encounter (Signed)
Noted  

## 2022-10-18 NOTE — Telephone Encounter (Signed)
PT is scheduled for OV today at 230pm and is cancelling because she has to go out of town

## 2022-11-10 ENCOUNTER — Encounter: Payer: Self-pay | Admitting: Plastic Surgery

## 2022-11-10 ENCOUNTER — Ambulatory Visit: Payer: Commercial Managed Care - PPO | Admitting: Plastic Surgery

## 2022-11-10 VITALS — BP 128/84 | HR 89 | Ht 67.0 in | Wt 222.2 lb

## 2022-11-10 DIAGNOSIS — E65 Localized adiposity: Secondary | ICD-10-CM | POA: Diagnosis not present

## 2022-11-10 NOTE — Progress Notes (Signed)
Referring Provider Early, Sung Amabile, NP [address] [place],  [place] [address]   CC:  Chief Complaint  Patient presents with   Consult           Cassidy Leach is an 52 y.o. female.  HPI: Cassidy Leach is a 52 year old female who underwent a panniculectomy in December 2022.  She returns today complaining of difficulty keeping her pants up.  She states that they will not pull up past the bottom portion of her abdomen and often slipped down.  She is curious if there is something that can be done surgically to make it easier for her pants to stay up.  No Known Allergies  Outpatient Encounter Medications as of 11/10/2022  Medication Sig   albuterol (VENTOLIN HFA) 108 (90 Base) MCG/ACT inhaler Inhale 2 puffs into the lungs every 6 (six) hours as needed for wheezing.   azelastine (ASTELIN) 0.1 % nasal spray Place 2 sprays into both nostrils 2 (two) times daily. Use in each nostril as directed   blood glucose meter kit and supplies Monitor blood sugar every morning before eating or drinking and write on log. May monitor at additional times during the day if having symptoms.   Blood Glucose Monitoring Suppl DEVI 1 each by Does not apply route in the morning, at noon, and at bedtime. May substitute to any manufacturer covered by patient's insurance. (One touch ultra, if available).   Continuous Blood Gluc Receiver (DEXCOM G6 RECEIVER) DEVI Use as directed   Continuous Blood Gluc Transmit (DEXCOM G6 TRANSMITTER) MISC Use as directed   dicyclomine (BENTYL) 10 MG capsule Take 1 capsule (10 mg total) by mouth 4 (four) times daily -  before meals and at bedtime.   fluconazole (DIFLUCAN) 150 MG tablet TAKE 1 TABLET(150 MG) BY MOUTH 1 TIME FOR 1 DOSE. MAY REFILL AND. REPEAT IN 72 HOURS IF SYMPTOMS PERSIST   fluticasone (FLONASE) 50 MCG/ACT nasal spray Place into both nostrils.   Lancets (ONETOUCH DELICA PLUS LANCET33G) MISC Use to test blood sugar every morning   levocetirizine (XYZAL) 5 MG  tablet Take 1 tablet (5 mg total) by mouth every evening.   magic mouthwash (nystatin, hydrocortisone, diphenhydrAMINE) suspension Swish and spit 5 mLs 4 (four) times daily as needed for mouth pain.   nystatin (MYCOSTATIN) 100000 UNIT/ML suspension Take 2 mLs (200,000 Units total) by mouth 4 (four) times daily.   omeprazole (PRILOSEC) 40 MG capsule Take 1 capsule (40 mg total) by mouth daily.   ondansetron (ZOFRAN) 8 MG tablet Take 1 tablet (8 mg total) by mouth every 8 (eight) hours as needed for nausea or vomiting.   predniSONE (DELTASONE) 20 MG tablet Take 1 tablet (20 mg total) by mouth daily with breakfast.   rosuvastatin (CRESTOR) 20 MG tablet Take 1 tablet (20 mg total) by mouth daily.   Semaglutide,0.25 or 0.5MG /DOS, (OZEMPIC, 0.25 OR 0.5 MG/DOSE,) 2 MG/3ML SOPN Inject 0.25 mg into the skin once a week.   Vitamin D, Ergocalciferol, (DRISDOL) 1.25 MG (50000 UNIT) CAPS capsule Take one tablet by mouth twice a week for 16 weeks.   No facility-administered encounter medications on file as of 11/10/2022.     Past Medical History:  Diagnosis Date   Allergy    Seasonal   Asthma    Diabetes mellitus    Encounter to establish care 11/10/2020   Hyperlipidemia    Hyperlipidemia 03/27/2014   Impaired fasting glucose 05/17/2017   Myalgia and myositis    Osteoarthrosis involving lower leg  11/27/2012    Past Surgical History:  Procedure Laterality Date   ABDOMINAL HYSTERECTOMY     FOOT SURGERY     KNEE SURGERY     PANNICULECTOMY N/A 07/09/2021   Procedure: PANNICULECTOMY;  Surgeon: Allena Napoleon, MD;  Location: [facility];  Service: Plastics;  Laterality: N/A;   TUBAL LIGATION      No family history on file.  Social History   Social History Narrative   Not on file     Review of Systems General: Denies fevers, chills, weight loss CV: Denies chest pain, shortness of breath, palpitations Abdomen: Protuberant abdomen with localized adiposity on the flanks  Physical  Exam    11/10/2022    1:17 PM 09/22/2022    8:10 AM 08/11/2022    4:13 PM  Vitals with BMI  Height     Weight 222 lbs 3 oz 220 lbs 220 lbs 3 oz  BMI 34.79 34.45   Systolic 128  120  Diastolic 84  78  Pulse 89  103    General:  No acute distress,  Alert and oriented, Non-Toxic, Normal speech and affect Abdomen: Patient's incisions are all very well-healed.  She has a nice result from the panniculectomy but has a very large round protuberant abdomen above the umbilicus.  She also has areas of fat deposits on the flank. Mammogram: Mammogram August 2023 was BI-RADS 1 Assessment/Plan Localized adiposity: The patient has a very round protuberant abdomen however most of her fat is intra-abdominal with very little subcutaneous fat.  We discussed this at length and the fact that there is no one who can resect the intra-abdominal fat.  Liposuction of the small areas of flank adiposity will not change her inability to pull her pants up over the central portion of her abdomen.  She will have to deal with this with weight loss.  She understands.  May return as desired.  [name] 11/10/2022, 2:33 PM

## 2022-11-14 ENCOUNTER — Encounter: Payer: Self-pay | Admitting: Nurse Practitioner

## 2022-11-15 ENCOUNTER — Encounter: Payer: Self-pay | Admitting: Nurse Practitioner

## 2022-11-15 ENCOUNTER — Telehealth: Payer: Commercial Managed Care - PPO | Admitting: Nurse Practitioner

## 2022-11-15 VITALS — Wt 220.0 lb

## 2022-11-15 DIAGNOSIS — J4520 Mild intermittent asthma, uncomplicated: Secondary | ICD-10-CM | POA: Diagnosis not present

## 2022-11-15 DIAGNOSIS — J0140 Acute pansinusitis, unspecified: Secondary | ICD-10-CM

## 2022-11-15 DIAGNOSIS — J3089 Other allergic rhinitis: Secondary | ICD-10-CM

## 2022-11-15 HISTORY — DX: Acute pansinusitis, unspecified: J01.40

## 2022-11-15 MED ORDER — AMOXICILLIN-POT CLAVULANATE 875-125 MG PO TABS: 1.0000 | ORAL_TABLET | Freq: Two times a day (BID) | ORAL | 0 refills | Status: DC

## 2022-11-15 NOTE — Patient Instructions (Addendum)
I have sent in Augmentin for you to use for sinus infection. If you are not feeling better or not completely better towards the end of the treatment, send me a message and we can extend it a few more days.   If at any point you feel like your asthma is acting up, please let me know.

## 2022-11-15 NOTE — Progress Notes (Signed)
Virtual Visit Encounter mychart visit.   I connected with  Cassidy Leach on 11/21/22 at  9:15 AM EDT by secure video and audio telemedicine application. I verified that I am speaking with the correct person using two identifiers.   I introduced myself as a Publishing rights manager with the practice. The limitations of evaluation and management by telemedicine discussed with the patient and the availability of in person appointments. The patient expressed verbal understanding and consent to proceed.  Participating parties in this visit include: Myself and patient  The patient is: Patient Location: Home I am: Provider Location: Office/Clinic Subjective:    CC and HPI: Cassidy Leach is a 52 y.o. year old female presenting for new evaluation and treatment of aching, chills, ear pain, sinus pressure. Patient reports the following:  [name] presents today with chief complaint of sudden onset of chills, body aches, congestion, cough and pressure when pushing on her sinuses.  She denies any current fevers. Anquanette has a history of allergies and asthma.  She is currently using allergy medications but has not tried Xyzal before.  Her allergy symptoms have been exacerbated this spring.  She reports that her asthma is well-controlled at this time.  She is not having any shortness of breath or respiratory distress. She does have a history of vaginal yeast infections with antibiotic use.  Past medical history, Surgical history, Family history not pertinant except as noted below, Social history, Allergies, and medications have been entered into the medical record, reviewed, and corrections made.   Review of Systems:  All review of systems negative except what is listed in the HPI  Objective:    Alert and oriented x 4 Speaking in clear sentences with no shortness of breath. No distress.  Impression and Recommendations:    Problem List Items Addressed This Visit     Asthma, mild intermittent     Currently her asthma appears to be well-controlled without any new exacerbations. Plan: -Monitor closely for worsening of asthma symptoms and notify me immediately if these present. -Continue current management.      Environmental and seasonal allergies    History of chronic seasonal allergies.  Most likely ongoing allergy symptoms have contributed to the onset of secondary bacterial infection.  Recommend continuation of montelukast and Flonase. Plan: - Consider transition to Xyzal to see if this helps improve allergy symptoms -If symptoms persist or worsen despite treatment please let me know and we can make referral to allergy and asthma center for further evaluation and recommendations.      Acute non-recurrent pansinusitis - Primary    Patient presents with sudden onset of chills, body aches, and congestion without fever.  Allergies have been exacerbated this season however these symptoms have worsened suddenly over the last day or so despite allergy treatment. Plan: - Will send in amoxicillin twice a day for 5 days for treatment of sinus infection -Be sure that you are taking allergy medicine regularly.  You may want to consider using Xyzal daily as this can be quite effective to help with reducing the chance of secondary infection associated with allergies. -If your symptoms do not get better with the antibiotic or get better or get worse again please let me know.      Relevant Medications   amoxicillin-clavulanate (AUGMENTIN) 875-125 MG tablet    orders and follow up as documented in EMR I discussed the assessment and treatment plan with the patient. The patient was provided an opportunity to ask questions and all  were answered. The patient agreed with the plan and demonstrated an understanding of the instructions.   The patient was advised to call back or seek an in-person evaluation if the symptoms worsen or if the condition fails to improve as anticipated.  Follow-Up:  prn  I provided 21 minutes of non-face-to-face interaction with this non face-to-face encounter including intake, same-day documentation, and chart review.   [name], NP , DNP, AGNP-c [facility]

## 2022-11-21 ENCOUNTER — Encounter: Payer: Self-pay | Admitting: Nurse Practitioner

## 2022-11-21 NOTE — Assessment & Plan Note (Signed)
History of chronic seasonal allergies.  Most likely ongoing allergy symptoms have contributed to the onset of secondary bacterial infection.  Recommend continuation of montelukast and Flonase. Plan: - Consider transition to Xyzal to see if this helps improve allergy symptoms -If symptoms persist or worsen despite treatment please let me know and we can make referral to allergy and asthma center for further evaluation and recommendations.

## 2022-11-21 NOTE — Assessment & Plan Note (Signed)
Patient presents with sudden onset of chills, body aches, and congestion without fever.  Allergies have been exacerbated this season however these symptoms have worsened suddenly over the last day or so despite allergy treatment. Plan: - Will send in amoxicillin twice a day for 5 days for treatment of sinus infection -Be sure that you are taking allergy medicine regularly.  You may want to consider using Xyzal daily as this can be quite effective to help with reducing the chance of secondary infection associated with allergies. -If your symptoms do not get better with the antibiotic or get better or get worse again please let me know.

## 2022-11-21 NOTE — Assessment & Plan Note (Signed)
Currently her asthma appears to be well-controlled without any new exacerbations. Plan: -Monitor closely for worsening of asthma symptoms and notify me immediately if these present. -Continue current management.

## 2022-11-27 ENCOUNTER — Encounter: Payer: Self-pay | Admitting: Nurse Practitioner

## 2022-11-29 ENCOUNTER — Encounter: Payer: Self-pay | Admitting: Nurse Practitioner

## 2022-11-29 ENCOUNTER — Ambulatory Visit: Payer: Commercial Managed Care - PPO | Admitting: Nurse Practitioner

## 2022-11-29 VITALS — BP 122/78 | HR 84 | Wt 221.0 lb

## 2022-11-29 DIAGNOSIS — S90421A Blister (nonthermal), right great toe, initial encounter: Secondary | ICD-10-CM

## 2022-11-29 NOTE — Patient Instructions (Signed)
Use the antibiotic ointment and gauze to wrap the toe until Friday. The skin will likely peel off of the side of the toe, but we want the skin underneath to  be intact before that comes off. If you notice the toe getting hot or puss develops, call and ask for Cassidy Leach and he will let me know.

## 2022-11-29 NOTE — Progress Notes (Signed)
  [name], DNP, AGNP-c [facility] [address] [place], [place] [address] [phone]  Subjective:   Cassidy Leach is a 52 y.o. female presents to day for evaluation of: Right foot pain and swelling  The patient presents with pain and tenderness in her toe on the foot that she previously had surgery on. She describes the pain as constant and has noticed a darkened area on the toe. The patient reports that her foot swells throughout the day, and she primarily wears open-toed shoes due to the swelling. She has not worn any new shoes recently. The patient has a history of nerve damage in the affected toe due to a surgical complication involving a screw pressing on a nerve. PMH, Medications, and Allergies reviewed and updated in chart as appropriate.   ROS negative except for what is listed in HPI. Objective:  BP 122/78   Pulse 84   Wt 221 lb (100.2 kg)   BMI 34.61 kg/m  Physical Exam Vitals and nursing note reviewed.  Constitutional:      Appearance: Normal appearance.  Musculoskeletal:       Feet:  Feet:     Comments: Tenderness noted around the end of the great toe on the right foot with swelling noted.  Discoloration and bullous formation with milky fluid near the painful area. Limited sensation noted.  Skin:    General: Skin is warm and dry.  Neurological:     Mental Status: She is alert.           Assessment & Plan:   Problem List Items Addressed This Visit     Blister of great toe of right foot - Primary    The patient has a history of surgery on the left foot and is experiencing increased pain and swelling, particularly in one toe. On examination, there is tenderness and a possible pus pocket forming. The patient has a history of nerve damage in the affected toe. Plan: - Prescribe a short course of antibiotics to address the possible infection. - Instruct the patient to apply antibiotic ointment and wrap the affected toe with gauze  for the next few days, removing the dressing at bedtime to allow for air exposure and reapplying in the morning. - Advise the patient to call the clinic and ask for Adam if there are any changes in the condition of the toe, such as increased pain, pus discharge, or worsening infection. If necessary, schedule a follow-up appointment for Friday afternoon.          [name], DNP, AGNP-c 12/12/2022  9:06 PM    History, Medications, Surgery, SDOH, and Family History reviewed and updated as appropriate.

## 2022-12-07 ENCOUNTER — Institutional Professional Consult (permissible substitution): Payer: Commercial Managed Care - PPO | Admitting: Plastic Surgery

## 2022-12-12 DIAGNOSIS — S90421A Blister (nonthermal), right great toe, initial encounter: Secondary | ICD-10-CM

## 2022-12-12 HISTORY — DX: Blister (nonthermal), right great toe, initial encounter: S90.421A

## 2022-12-12 NOTE — Assessment & Plan Note (Signed)
The patient has a history of surgery on the left foot and is experiencing increased pain and swelling, particularly in one toe. On examination, there is tenderness and a possible pus pocket forming. The patient has a history of nerve damage in the affected toe. Plan: - Prescribe a short course of antibiotics to address the possible infection. - Instruct the patient to apply antibiotic ointment and wrap the affected toe with gauze for the next few days, removing the dressing at bedtime to allow for air exposure and reapplying in the morning. - Advise the patient to call the clinic and ask for Adam if there are any changes in the condition of the toe, such as increased pain, pus discharge, or worsening infection. If necessary, schedule a follow-up appointment for Friday afternoon.

## 2022-12-21 ENCOUNTER — Encounter: Payer: Self-pay | Admitting: Nurse Practitioner

## 2022-12-22 ENCOUNTER — Encounter: Payer: Self-pay | Admitting: Nurse Practitioner

## 2022-12-22 ENCOUNTER — Ambulatory Visit: Payer: Commercial Managed Care - PPO | Admitting: Nurse Practitioner

## 2022-12-22 VITALS — BP 122/78 | HR 94 | Wt 221.4 lb

## 2022-12-22 DIAGNOSIS — M62830 Muscle spasm of back: Secondary | ICD-10-CM | POA: Diagnosis not present

## 2022-12-22 DIAGNOSIS — H66001 Acute suppurative otitis media without spontaneous rupture of ear drum, right ear: Secondary | ICD-10-CM | POA: Diagnosis not present

## 2022-12-22 DIAGNOSIS — M5416 Radiculopathy, lumbar region: Secondary | ICD-10-CM | POA: Diagnosis not present

## 2022-12-22 MED ORDER — CYCLOBENZAPRINE HCL 10 MG PO TABS: 10.0000 mg | ORAL_TABLET | Freq: Every day | ORAL | 1 refills | Status: DC

## 2022-12-22 MED ORDER — AZITHROMYCIN 250 MG PO TABS
ORAL_TABLET | ORAL | 0 refills | Status: AC
Start: 2022-12-22 — End: 2022-12-27

## 2022-12-22 MED ORDER — PREDNISONE 10 MG PO TABS: ORAL_TABLET | ORAL | 0 refills | Status: DC

## 2022-12-22 MED ORDER — GABAPENTIN 300 MG PO CAPS: 300.0000 mg | ORAL_CAPSULE | Freq: Three times a day (TID) | ORAL | 1 refills | Status: DC

## 2022-12-22 NOTE — Progress Notes (Signed)
[name], DNP, AGNP-c [facility] [address] [place], [place] [address] [phone]  Subjective:   Cassidy Leach is a 52 y.o. female presents to day for evaluation of: low back pain.  Donicia presents today with chief complaints of back pain in the left lower region that extends down her left leg. The pain began on a Tuesday when she attempted to stand up from her desk, described as a "weird" sensation that persisted. The pain sometimes reaches her buttock but is primarily localized to a specific area. She describes the pain as sciatic-like, noting that it does not extend to her feet but stops partway down her leg.  She mentions that bending over provides some relief, as demonstrated when leaning on her daughter's bed. However, she struggles with sleeping due to discomfort and difficulty turning, indicating that the pain is confined to one side of her body.  Sabah also discusses her attempts at managing the pain, including taking a hot bath, which provided some relief, and applying heat to the affected area. She has not tried using ice. She expresses concerns about medication options due to her bariatric surgery, which limits her use of certain anti-inflammatory drugs. She recalls past experiences with medications like gabapentin and Flexeril, which were prescribed to manage her symptoms, and she plans to continue using these medications along with prescribed stretches and exercises to alleviate her discomfort.  Right ear Thaily also notes sensation of fullness and pain in the right ear. She has not had any fevers that she knows of. She does have some soreness in her throat on that side.   PMH, Medications, and Allergies reviewed and updated in chart as appropriate.   ROS negative except for what is listed in HPI. Objective:  BP 122/78   Pulse 94   Wt 221 lb 6.4 oz (100.4 kg)   BMI 34.68 kg/m  Physical Exam Vitals and nursing note reviewed.   Constitutional:      Appearance: Normal appearance.  HENT:     Head: Normocephalic.     Right Ear: A middle ear effusion is present.     Left Ear: A middle ear effusion is present.     Ears:     Comments: Purulent effusion in the right with mild clear effusion on the left. TM intact bilaterally. Mild erythema noted in the throat. Sinus tenderness.    Mouth/Throat:     Mouth: Mucous membranes are moist.     Pharynx: Posterior oropharyngeal erythema present.  Eyes:     General: No scleral icterus. Cardiovascular:     Rate and Rhythm: Normal rate and regular rhythm.     Pulses: Normal pulses.     Heart sounds: Normal heart sounds.  Pulmonary:     Effort: Pulmonary effort is normal.     Breath sounds: Normal breath sounds.  Musculoskeletal:     Comments: Tenderness in the lumbar spine into the gluteal region and hip on the left side with deep palpation and flexion and extension of the back. Pain present with standing and sitting motions.   Lymphadenopathy:     Cervical: Cervical adenopathy present.  Skin:    General: Skin is warm and dry.     Capillary Refill: Capillary refill takes less than 2 seconds.  Neurological:     General: No focal deficit present.     Mental Status: She is alert and oriented to person, place, and time.  Psychiatric:        Mood and Affect:  Mood normal.           Assessment & Plan:   Problem List Items Addressed This Visit     Acute left lumbar radiculopathy - Primary    Radicular symptoms present on the left. Consider sciatic involvement and possible piriformis. No neurological deficits present. There does appear to be spasms in the gluteal muscles, which are likely contributing.  Plan: - Gentle stretching exercises, muscle relaxants, and steroid burst to help with inflammation.  - Consider PT referral if symptoms do not improve      Relevant Medications   predniSONE (DELTASONE) 10 MG tablet   cyclobenzaprine (FLEXERIL) 10 MG tablet    gabapentin (NEURONTIN) 300 MG capsule   Non-recurrent acute suppurative otitis media of right ear without spontaneous rupture of tympanic membrane    Symptoms consistent with otitis media. Will send azithromycin for treatment. F/U if no improvement.       Other Visit Diagnoses     Back muscle spasm       Relevant Medications   cyclobenzaprine (FLEXERIL) 10 MG tablet         [name], DNP, AGNP-c 01/18/2023  8:05 AM    History, Medications, Surgery, SDOH, and Family History reviewed and updated as appropriate.

## 2023-01-18 ENCOUNTER — Encounter: Payer: Self-pay | Admitting: Nurse Practitioner

## 2023-01-18 DIAGNOSIS — M5416 Radiculopathy, lumbar region: Secondary | ICD-10-CM | POA: Insufficient documentation

## 2023-01-18 DIAGNOSIS — H66001 Acute suppurative otitis media without spontaneous rupture of ear drum, right ear: Secondary | ICD-10-CM | POA: Insufficient documentation

## 2023-01-18 HISTORY — DX: Radiculopathy, lumbar region: M54.16

## 2023-01-18 HISTORY — DX: Acute suppurative otitis media without spontaneous rupture of ear drum, right ear: H66.001

## 2023-01-18 NOTE — Assessment & Plan Note (Signed)
Symptoms consistent with otitis media. Will send azithromycin for treatment. F/U if no improvement.

## 2023-01-18 NOTE — Assessment & Plan Note (Signed)
Radicular symptoms present on the left. Consider sciatic involvement and possible piriformis. No neurological deficits present. There does appear to be spasms in the gluteal muscles, which are likely contributing.  Plan: - Gentle stretching exercises, muscle relaxants, and steroid burst to help with inflammation.  - Consider PT referral if symptoms do not improve

## 2023-03-06 ENCOUNTER — Ambulatory Visit (INDEPENDENT_AMBULATORY_CARE_PROVIDER_SITE_OTHER): Payer: Commercial Managed Care - PPO

## 2023-03-06 ENCOUNTER — Encounter: Payer: Self-pay | Admitting: Podiatry

## 2023-03-06 ENCOUNTER — Ambulatory Visit: Payer: Commercial Managed Care - PPO | Admitting: Podiatry

## 2023-03-06 DIAGNOSIS — M7741 Metatarsalgia, right foot: Secondary | ICD-10-CM

## 2023-03-06 DIAGNOSIS — M216X1 Other acquired deformities of right foot: Secondary | ICD-10-CM

## 2023-03-06 DIAGNOSIS — G629 Polyneuropathy, unspecified: Secondary | ICD-10-CM

## 2023-03-06 DIAGNOSIS — M7742 Metatarsalgia, left foot: Secondary | ICD-10-CM

## 2023-03-06 NOTE — Patient Instructions (Signed)
Patient was present and measured for Custom Foot Orthotics  Patient has Metatarsalgia BIL Right > than Left  Orthotics will help to increase support provide total contact to BIL MLA's helping to reduce pain and pressure off plantar aspect of BIL feet  Scans taken today patient wishes to call INS to find out coverage and then will call us back to cancel or proceed  Addison Bailey CPed, CFo, CFm

## 2023-03-06 NOTE — Progress Notes (Signed)
  Subjective:  Patient ID: Cassidy Leach, female    DOB: [birth year],  MRN: [id number]  Chief Complaint  Patient presents with   Foot Pain    Still gets pain in the great toe on the right, the toe is still numb since the surgery.and she gets pain all over her foot she stated.    DOS: 02/05/2021 Procedure: Right foot screw removal  52 y.o. female returns for follow-up on her right foot, now she has pain and swelling sometimes in the second and third toe joints  Review of Systems: Negative except as noted in the HPI. Denies N/V/F/Ch.   Objective:  There were no vitals filed for this visit. There is no height or weight on file to calculate BMI. Constitutional Well developed. Well nourished.  Vascular Foot warm and well perfused. Capillary refill normal to all digits.   Neurologic Normal speech. Oriented to person, place, and time. Epicritic sensation to light touch grossly present bilaterally.  Dermatologic Surgical incision is well-healed thickening has resolved, she does have numbness along the incision, no radiating pain  Orthopedic: Some tenderness submetatarsal 2 and 3, minimal edema today   Multiple view plain film radiographs: No acute osseous abnormalities, she does have a shortened first ray compared to the second and third, approximately 5 mm increase in the second metatarsal protrusion distance from preop Assessment:   1. Metatarsalgia of both feet   2. Plantar flexed metatarsal bone of right foot    Plan:  Patient was evaluated and treated and all questions answered.  S/p foot surgery right -We reviewed her x-rays.  We discussed that likely she is developing metatarsalgia and we discussed proper shoe gear to avoid and mitigate this.  We also discussed the use of an orthotic inserts which I think likely would benefit by offloading the second and third MTPJ's and supporting the mid arch with a metatarsal pad and arch support.  She was scanned for these today.   Long-term shortening of the second and third metatarsals may offer relief as well.    No follow-ups on file.

## 2023-03-16 ENCOUNTER — Ambulatory Visit: Payer: Commercial Managed Care - PPO | Admitting: Podiatry

## 2023-03-22 ENCOUNTER — Telehealth: Payer: Self-pay | Admitting: Nurse Practitioner

## 2023-03-22 ENCOUNTER — Encounter: Payer: Self-pay | Admitting: Nurse Practitioner

## 2023-03-22 DIAGNOSIS — Z0000 Encounter for general adult medical examination without abnormal findings: Secondary | ICD-10-CM

## 2023-03-22 DIAGNOSIS — E1169 Type 2 diabetes mellitus with other specified complication: Secondary | ICD-10-CM

## 2023-03-22 DIAGNOSIS — E559 Vitamin D deficiency, unspecified: Secondary | ICD-10-CM

## 2023-03-22 DIAGNOSIS — E1165 Type 2 diabetes mellitus with hyperglycemia: Secondary | ICD-10-CM

## 2023-03-22 DIAGNOSIS — Z6836 Body mass index (BMI) 36.0-36.9, adult: Secondary | ICD-10-CM

## 2023-03-22 NOTE — Telephone Encounter (Signed)
Pt scheduled CPE for late October , wants to come in prior for fasting labs, ? Ok if so please place orders

## 2023-03-24 NOTE — Addendum Note (Signed)
Addended by: Claxton Levitz, [name] E on: 03/24/2023 01:15 PM   Modules accepted: Orders

## 2023-03-24 NOTE — Telephone Encounter (Signed)
I put these in for her. Thank you.

## 2023-04-05 ENCOUNTER — Other Ambulatory Visit: Payer: Commercial Managed Care - PPO

## 2023-04-14 ENCOUNTER — Other Ambulatory Visit (HOSPITAL_BASED_OUTPATIENT_CLINIC_OR_DEPARTMENT_OTHER): Payer: Self-pay | Admitting: Nurse Practitioner

## 2023-04-14 DIAGNOSIS — E1165 Type 2 diabetes mellitus with hyperglycemia: Secondary | ICD-10-CM

## 2023-04-20 ENCOUNTER — Telehealth: Payer: Commercial Managed Care - PPO | Admitting: Nurse Practitioner

## 2023-05-23 ENCOUNTER — Encounter: Payer: Commercial Managed Care - PPO | Admitting: Nurse Practitioner

## 2023-05-27 ENCOUNTER — Encounter: Payer: Self-pay | Admitting: Nurse Practitioner

## 2023-05-29 ENCOUNTER — Other Ambulatory Visit: Payer: Self-pay

## 2023-05-29 MED ORDER — OMEPRAZOLE 40 MG PO CPDR: 40.0000 mg | DELAYED_RELEASE_CAPSULE | Freq: Every day | ORAL | 1 refills | Status: DC

## 2023-06-28 ENCOUNTER — Encounter: Payer: Self-pay | Admitting: Nurse Practitioner

## 2023-07-07 ENCOUNTER — Other Ambulatory Visit: Payer: Commercial Managed Care - PPO

## 2023-07-07 DIAGNOSIS — E1169 Type 2 diabetes mellitus with other specified complication: Secondary | ICD-10-CM

## 2023-07-07 DIAGNOSIS — E559 Vitamin D deficiency, unspecified: Secondary | ICD-10-CM

## 2023-07-07 DIAGNOSIS — Z0000 Encounter for general adult medical examination without abnormal findings: Secondary | ICD-10-CM

## 2023-07-07 DIAGNOSIS — E1165 Type 2 diabetes mellitus with hyperglycemia: Secondary | ICD-10-CM

## 2023-07-07 DIAGNOSIS — Z6836 Body mass index (BMI) 36.0-36.9, adult: Secondary | ICD-10-CM

## 2023-07-07 LAB — LIPID PANEL

## 2023-07-08 LAB — CBC WITH DIFFERENTIAL/PLATELET
Basophils Absolute: 0 10*3/uL (ref 0.0–0.2)
Basos: 1 %
EOS (ABSOLUTE): 0.2 10*3/uL (ref 0.0–0.4)
Eos: 3 %
Hematocrit: 40.9 % (ref 34.0–46.6)
Hemoglobin: 13.3 g/dL (ref 11.1–15.9)
Immature Grans (Abs): 0 10*3/uL (ref 0.0–0.1)
Immature Granulocytes: 0 %
Lymphocytes Absolute: 3 10*3/uL (ref 0.7–3.1)
Lymphs: 58 %
MCH: 28.9 pg (ref 26.6–33.0)
MCHC: 32.5 g/dL (ref 31.5–35.7)
MCV: 89 fL (ref 79–97)
Monocytes Absolute: 0.3 10*3/uL (ref 0.1–0.9)
Monocytes: 7 %
Neutrophils Absolute: 1.6 10*3/uL (ref 1.4–7.0)
Neutrophils: 31 %
Platelets: 279 10*3/uL (ref 150–450)
RBC: 4.61 x10E6/uL (ref 3.77–5.28)
RDW: 13.4 % (ref 11.7–15.4)
WBC: 5.1 10*3/uL (ref 3.4–10.8)

## 2023-07-08 LAB — COMPREHENSIVE METABOLIC PANEL
ALT: 12 IU/L (ref 0–32)
AST: 16 IU/L (ref 0–40)
Albumin: 4.3 g/dL (ref 3.8–4.9)
Alkaline Phosphatase: 57 IU/L (ref 44–121)
BUN/Creatinine Ratio: 11 (ref 9–23)
BUN: 9 mg/dL (ref 6–24)
Bilirubin Total: 0.3 mg/dL (ref 0.0–1.2)
CO2: 19 mmol/L — ABNORMAL LOW (ref 20–29)
Calcium: 9.5 mg/dL (ref 8.7–10.2)
Chloride: 103 mmol/L (ref 96–106)
Creatinine, Ser: 0.79 mg/dL (ref 0.57–1.00)
Globulin, Total: 3.2 g/dL (ref 1.5–4.5)
Glucose: 85 mg/dL (ref 70–99)
Potassium: 4.2 mmol/L (ref 3.5–5.2)
Sodium: 140 mmol/L (ref 134–144)
Total Protein: 7.5 g/dL (ref 6.0–8.5)
eGFR: 89 mL/min/{1.73_m2} (ref >59)

## 2023-07-08 LAB — LIPID PANEL
Cholesterol, Total: 229 mg/dL — ABNORMAL HIGH (ref 100–199)
HDL: 34 mg/dL — ABNORMAL LOW (ref >39)
LDL CALC COMMENT:: 6.7 ratio — ABNORMAL HIGH (ref 0.0–4.4)
LDL Chol Calc (NIH): 163 mg/dL — ABNORMAL HIGH (ref 0–99)
Triglycerides: 175 mg/dL — ABNORMAL HIGH (ref 0–149)
VLDL Cholesterol Cal: 32 mg/dL (ref 5–40)

## 2023-07-08 LAB — HEMOGLOBIN A1C
Est. average glucose Bld gHb Est-mCnc: 128 mg/dL
Hgb A1c MFr Bld: 6.1 % — ABNORMAL HIGH (ref 4.8–5.6)

## 2023-07-08 LAB — VITAMIN D 25 HYDROXY (VIT D DEFICIENCY, FRACTURES): Vit D, 25-Hydroxy: 13.8 ng/mL — ABNORMAL LOW (ref 30.0–100.0)

## 2023-07-17 ENCOUNTER — Encounter: Payer: Self-pay | Admitting: Nurse Practitioner

## 2023-07-17 ENCOUNTER — Ambulatory Visit (INDEPENDENT_AMBULATORY_CARE_PROVIDER_SITE_OTHER): Payer: Commercial Managed Care - PPO | Admitting: Nurse Practitioner

## 2023-07-17 VITALS — BP 120/84 | HR 94 | Temp 98.6°F | Ht 67.0 in | Wt 224.0 lb

## 2023-07-17 DIAGNOSIS — Z0000 Encounter for general adult medical examination without abnormal findings: Secondary | ICD-10-CM

## 2023-07-17 DIAGNOSIS — E785 Hyperlipidemia, unspecified: Secondary | ICD-10-CM

## 2023-07-17 DIAGNOSIS — Z6836 Body mass index (BMI) 36.0-36.9, adult: Secondary | ICD-10-CM

## 2023-07-17 DIAGNOSIS — E1169 Type 2 diabetes mellitus with other specified complication: Secondary | ICD-10-CM | POA: Diagnosis not present

## 2023-07-17 DIAGNOSIS — K219 Gastro-esophageal reflux disease without esophagitis: Secondary | ICD-10-CM

## 2023-07-17 DIAGNOSIS — E559 Vitamin D deficiency, unspecified: Secondary | ICD-10-CM

## 2023-07-17 DIAGNOSIS — E1165 Type 2 diabetes mellitus with hyperglycemia: Secondary | ICD-10-CM | POA: Diagnosis not present

## 2023-07-17 DIAGNOSIS — M79671 Pain in right foot: Secondary | ICD-10-CM | POA: Insufficient documentation

## 2023-07-17 DIAGNOSIS — G4739 Other sleep apnea: Secondary | ICD-10-CM

## 2023-07-17 DIAGNOSIS — G4733 Obstructive sleep apnea (adult) (pediatric): Secondary | ICD-10-CM

## 2023-07-17 HISTORY — DX: Pain in right foot: M79.671

## 2023-07-17 MED ORDER — OZEMPIC (1 MG/DOSE) 2 MG/1.5ML ~~LOC~~ SOPN: 1.0000 mg | PEN_INJECTOR | SUBCUTANEOUS | 0 refills | Status: DC

## 2023-07-17 MED ORDER — ROSUVASTATIN CALCIUM 40 MG PO TABS: 40.0000 mg | ORAL_TABLET | Freq: Every day | ORAL | 3 refills | Status: DC

## 2023-07-17 MED ORDER — VITAMIN D (ERGOCALCIFEROL) 1.25 MG (50000 UNIT) PO CAPS: 50000.0000 [IU] | ORAL_CAPSULE | ORAL | 3 refills | Status: AC

## 2023-07-17 MED ORDER — SEMAGLUTIDE (2 MG/DOSE) 8 MG/3ML ~~LOC~~ SOPN: 2.0000 mg | PEN_INJECTOR | SUBCUTANEOUS | 5 refills | Status: DC

## 2023-07-17 NOTE — Progress Notes (Signed)
Cassidy Clamp, DNP, AGNP-c [facility] [address] [place], [place] [address] Main Office [phone]  BP 120/84   Pulse 94   Temp 98.6 F (37 C) (Oral)   Ht 5\' 7"  (1.702 m)   Wt 224 lb (101.6 kg)   BMI 35.08 kg/m    Subjective:    Patient ID: Cassidy Leach, female    DOB: 11-11-1970, 52 y.o.   MRN: [id number]  HPI: Cassidy Leach is a 52 y.o. female presenting on 07/17/2023 for comprehensive medical examination.   History of Present Illness Cassidy Leach, with a history of diabetes, vitamin D deficiency, and high cholesterol, presents for annual exam.  The patient reports persistent pain in the foot following a previous surgery, which involved the insertion and subsequent removal of a screw. The patient describes the foot as constantly swollen, with numbness and limited mobility in the big toe. She is interested in a second opinion to see if there is anything that can be done.  The patient also reports a recent episode of a sensation of something stuck in the throat, which was self-managed with omeprazole for suspected acid reflux. The patient has a history of acid reflux, particularly at night, and is considering purchasing an adjustable bed to alleviate symptoms.  The patient has been experiencing irregular bowel movements, which she describes as a recurring issue.    She also reports intermittent headaches, which she manages with Tylenol and going to bed. She denies associated symptoms.   Cassidy Leach's diabetes management includes Ozempic, which she reports has been delayed in delivery due to increased demand. The patient also reports consistently low vitamin D levels and high cholesterol, despite medication and dietary changes. The patient has been prescribed rosuvastatin for cholesterol management and vitamin D supplements.  The patient has expressed a desire for a handicap plaque due to difficulty with mobility and is considering seeking a second opinion  regarding her foot pain. She also reports a need for a new sleep study for potential CPAP therapy, as her previous CPAP machine is outdated. Pertinent items are noted in HPI.  IMMUNIZATIONS:   Flu Vaccine: Flu vaccine completed elsewhere this season. Record updated. Prevnar 13: Prevnar 13 N/A for this patient Prevnar 20: Prevnar 20 N/A for this patient Pneumovax 23: Pneumovax 23 N/A for this patient Vac Shingrix: Shingrix declined, patient will complete at a later date HPV: N/A or Aged Out Tetanus: Tetanus completed in the last 10 years COVID: Declined today. Information on where to obtain the vaccine was provided.  RSV: No  HEALTH MAINTENANCE: Pap Smear HM Status: N/A Mammogram HM Status: is up to date Colon Cancer Screening HM Status: is up to date Bone Density HM Status: N/A STI Testing HM Status: was declined  Lung CT HM Status: N/A  Concerns with vision, hearing, or dentition: No  Most Recent Depression Screen:     04/14/2022    1:12 PM 01/12/2022    9:23 AM 11/30/2021    9:24 AM 11/18/2021    1:51 PM 08/05/2021   12:17 PM  Depression screen PHQ 2/9  Decreased Interest 0 0 0 0 0  Down, Depressed, Hopeless 0 0 0 0 0  PHQ - 2 Score 0 0 0 0 0  Altered sleeping 0      Tired, decreased energy 0      Change in appetite 0      Feeling bad or failure about yourself  0      Trouble concentrating 0  Moving slowly or fidgety/restless 0      Suicidal thoughts 0      PHQ-9 Score 0      Difficult doing work/chores Not difficult at all       Most Recent Anxiety Screen:     04/14/2022    1:13 PM 11/10/2020   10:03 AM  GAD 7 : Generalized Anxiety Score  Nervous, Anxious, on Edge 0 0  Control/stop worrying 0 0  Worry too much - different things 0 0  Trouble relaxing 0 0  Restless 0 0  Easily annoyed or irritable 0 0  Afraid - awful might happen 0 0  Total GAD 7 Score 0 0  Anxiety Difficulty Not difficult at all    Most Recent Fall Screen:    06/24/2022    8:24 AM  04/14/2022    1:12 PM 01/12/2022    9:22 AM 11/30/2021    9:24 AM 11/18/2021    1:51 PM  Fall Risk   Falls in the past year? 0 0 0 0 0  Number falls in past yr: 0 0 0 0 0  Injury with Fall? 0 0 0 0 0  Risk for fall due to : No Fall Risks No Fall Risks No Fall Risks;Other (Comment) Other (Comment);No Fall Risks No Fall Risks  Follow up Falls evaluation completed Falls evaluation completed;Education provided Education provided;Falls evaluation completed Falls evaluation completed;Education provided Falls evaluation completed;Education provided    Past medical history, surgical history, medications, allergies, family history and social history reviewed with patient today and changes made to appropriate areas of the chart.  Past Medical History:  Past Medical History:  Diagnosis Date   Acute non-recurrent pansinusitis 11/15/2022   Allergy    Seasonal   Asthma    Bacterial URI 09/22/2022   Blister of great toe of right foot 12/12/2022   Burn of throat 08/20/2022   Changes in vision 09/02/2021   Diabetes mellitus (HCC)    Encounter to establish care 11/10/2020   Fall 04/14/2022   Generalized abdominal pain 06/24/2022   Hyperlipidemia    Hyperlipidemia 03/27/2014   Impaired fasting glucose 05/17/2017   Myalgia and myositis    Non-recurrent acute suppurative otitis media of right ear without spontaneous rupture of tympanic membrane 01/18/2023   Osteoarthrosis involving lower leg 11/27/2012   Subacute cough 10/12/2021   Medications:  Current Outpatient Medications on File Prior to Visit  Medication Sig   albuterol (VENTOLIN HFA) 108 (90 Base) MCG/ACT inhaler Inhale 2 puffs into the lungs every 6 (six) hours as needed for wheezing.   azelastine (ASTELIN) 0.1 % nasal spray Place 2 sprays into both nostrils 2 (two) times daily. Use in each nostril as directed   blood glucose meter kit and supplies Monitor blood sugar every morning before eating or drinking and write on log. May monitor at  additional times during the day if having symptoms.   Blood Glucose Monitoring Suppl DEVI 1 each by Does not apply route in the morning, at noon, and at bedtime. May substitute to any manufacturer covered by patient's insurance. (One touch ultra, if available).   Continuous Blood Gluc Receiver (DEXCOM G6 RECEIVER) DEVI Use as directed   Continuous Blood Gluc Transmit (DEXCOM G6 TRANSMITTER) MISC Use as directed   cyclobenzaprine (FLEXERIL) 10 MG tablet Take 1 tablet (10 mg total) by mouth at bedtime.   dicyclomine (BENTYL) 10 MG capsule Take 1 capsule (10 mg total) by mouth 4 (four) times daily -  before meals and  at bedtime.   fluticasone (FLONASE) 50 MCG/ACT nasal spray Place into both nostrils.   gabapentin (NEURONTIN) 300 MG capsule Take 1 capsule (300 mg total) by mouth 3 (three) times daily.   Lancets (ONETOUCH DELICA PLUS LANCET33G) MISC Use to test blood sugar every morning   nystatin (MYCOSTATIN) 100000 UNIT/ML suspension Take 2 mLs (200,000 Units total) by mouth 4 (four) times daily.   omeprazole (PRILOSEC) 40 MG capsule Take 1 capsule (40 mg total) by mouth daily.   levocetirizine (XYZAL) 5 MG tablet Take 1 tablet (5 mg total) by mouth every evening.   No current facility-administered medications on file prior to visit.   Surgical History:  Past Surgical History:  Procedure Laterality Date   ABDOMINAL HYSTERECTOMY     FOOT SURGERY     KNEE SURGERY     PANNICULECTOMY N/A 07/09/2021   Procedure: PANNICULECTOMY;  Surgeon: Allena Napoleon, MD;  Location: [facility];  Service: Plastics;  Laterality: N/A;   TUBAL LIGATION     Allergies:  No Known Allergies Family History:  No family history on file.     Objective:    BP 120/84   Pulse 94   Temp 98.6 F (37 C) (Oral)   Ht 5\' 7"  (1.702 m)   Wt 224 lb (101.6 kg)   BMI 35.08 kg/m   Wt Readings from Last 3 Encounters:  07/17/23 224 lb (101.6 kg)  12/22/22 221 lb 6.4 oz (100.4 kg)  11/29/22 221 lb (100.2 kg)     Physical Exam Vitals and nursing note reviewed.  Constitutional:      General: She is not in acute distress.    Appearance: Normal appearance. She is obese.  HENT:     Head: Normocephalic and atraumatic.     Right Ear: Hearing, tympanic membrane, ear canal and external ear normal.     Left Ear: Hearing, tympanic membrane, ear canal and external ear normal.     Nose: Nose normal.     Right Sinus: No maxillary sinus tenderness or frontal sinus tenderness.     Left Sinus: No maxillary sinus tenderness or frontal sinus tenderness.     Mouth/Throat:     Lips: Pink.     Mouth: Mucous membranes are moist.     Pharynx: Oropharynx is clear.  Eyes:     General: Lids are normal. Vision grossly intact.     Extraocular Movements: Extraocular movements intact.     Conjunctiva/sclera: Conjunctivae normal.     Pupils: Pupils are equal, round, and reactive to light.     Funduscopic exam:    Right eye: Red reflex present.        Left eye: Red reflex present.    Visual Fields: Right eye visual fields normal and left eye visual fields normal.  Neck:     Thyroid: No thyromegaly.     Vascular: No carotid bruit.  Cardiovascular:     Rate and Rhythm: Normal rate and regular rhythm.     Chest Wall: PMI is not displaced.     Pulses: Normal pulses.          Dorsalis pedis pulses are 2+ on the right side and 2+ on the left side.       Posterior tibial pulses are 2+ on the right side and 2+ on the left side.     Heart sounds: Normal heart sounds. No murmur heard. Pulmonary:     Effort: Pulmonary effort is normal. No respiratory distress.  Breath sounds: Normal breath sounds.  Abdominal:     General: Abdomen is flat. Bowel sounds are normal. There is no distension.     Palpations: Abdomen is soft. There is no hepatomegaly, splenomegaly or mass.     Tenderness: There is no abdominal tenderness. There is no right CVA tenderness, left CVA tenderness, guarding or rebound.  Musculoskeletal:         General: Normal range of motion.     Cervical back: Full passive range of motion without pain, normal range of motion and neck supple. No tenderness.     Right lower leg: No edema.     Left lower leg: No edema.  Feet:     Left foot:     Toenail Condition: Left toenails are normal.  Lymphadenopathy:     Cervical: No cervical adenopathy.     Upper Body:     Right upper body: No supraclavicular adenopathy.     Left upper body: No supraclavicular adenopathy.  Skin:    General: Skin is warm and dry.     Capillary Refill: Capillary refill takes less than 2 seconds.     Nails: There is no clubbing.  Neurological:     General: No focal deficit present.     Mental Status: She is alert and oriented to person, place, and time.     GCS: GCS eye subscore is 4. GCS verbal subscore is 5. GCS motor subscore is 6.     Sensory: Sensation is intact.     Motor: Motor function is intact.     Coordination: Coordination is intact.     Gait: Gait is intact.     Deep Tendon Reflexes: Reflexes are normal and symmetric.  Psychiatric:        Attention and Perception: Attention normal.        Mood and Affect: Mood normal.        Speech: Speech normal.        Behavior: Behavior normal. Behavior is cooperative.        Thought Content: Thought content normal.        Cognition and Memory: Cognition and memory normal.        Judgment: Judgment normal.      Results for orders placed or performed in visit on 07/07/23  VITAMIN D 25 Hydroxy (Vit-D Deficiency, Fractures)   Collection Time: 07/07/23 11:34 AM  Result Value Ref Range   Vit D, 25-Hydroxy 13.8 (L) 30.0 - 100.0 ng/mL  Lipid panel   Collection Time: 07/07/23 11:34 AM  Result Value Ref Range   Cholesterol, Total 229 (H) 100 - 199 mg/dL   Triglycerides 161 (H) 0 - 149 mg/dL   HDL 34 (L) >09 mg/dL   VLDL Cholesterol Cal 32 5 - 40 mg/dL   LDL Chol Calc (NIH) 604 (H) 0 - 99 mg/dL   Chol/HDL Ratio 6.7 (H) 0.0 - 4.4 ratio  Comprehensive metabolic  panel   Collection Time: 07/07/23 11:34 AM  Result Value Ref Range   Glucose 85 70 - 99 mg/dL   BUN 9 6 - 24 mg/dL   Creatinine, Ser 5.40 0.57 - 1.00 mg/dL   eGFR 89 >98 JX/BJY/7.82   BUN/Creatinine Ratio 11 9 - 23   Sodium 140 134 - 144 mmol/L   Potassium 4.2 3.5 - 5.2 mmol/L   Chloride 103 96 - 106 mmol/L   CO2 19 (L) 20 - 29 mmol/L   Calcium 9.5 8.7 - 10.2 mg/dL   Total Protein 7.5  6.0 - 8.5 g/dL   Albumin 4.3 3.8 - 4.9 g/dL   Globulin, Total 3.2 1.5 - 4.5 g/dL   Bilirubin Total 0.3 0.0 - 1.2 mg/dL   Alkaline Phosphatase 57 44 - 121 IU/L   AST 16 0 - 40 IU/L   ALT 12 0 - 32 IU/L  CBC with Differential/Platelet   Collection Time: 07/07/23 11:34 AM  Result Value Ref Range   WBC 5.1 3.4 - 10.8 x10E3/uL   RBC 4.61 3.77 - 5.28 x10E6/uL   Hemoglobin 13.3 11.1 - 15.9 g/dL   Hematocrit 32.4 40.1 - 46.6 %   MCV 89 79 - 97 fL   MCH 28.9 26.6 - 33.0 pg   MCHC 32.5 31.5 - 35.7 g/dL   RDW 02.7 25.3 - 66.4 %   Platelets 279 150 - 450 x10E3/uL   Neutrophils 31 Not Estab. %   Lymphs 58 Not Estab. %   Monocytes 7 Not Estab. %   Eos 3 Not Estab. %   Basos 1 Not Estab. %   Neutrophils Absolute 1.6 1.4 - 7.0 x10E3/uL   Lymphocytes Absolute 3.0 0.7 - 3.1 x10E3/uL   Monocytes Absolute 0.3 0.1 - 0.9 x10E3/uL   EOS (ABSOLUTE) 0.2 0.0 - 0.4 x10E3/uL   Basophils Absolute 0.0 0.0 - 0.2 x10E3/uL   Immature Granulocytes 0 Not Estab. %   Immature Grans (Abs) 0.0 0.0 - 0.1 x10E3/uL  Hemoglobin A1c   Collection Time: 07/07/23 11:34 AM  Result Value Ref Range   Hgb A1c MFr Bld 6.1 (H) 4.8 - 5.6 %   Est. average glucose Bld gHb Est-mCnc 128 mg/dL       Assessment & Plan:   Problem List Items Addressed This Visit     Gastroesophageal reflux disease without esophagitis   Sensation of something stuck in throat, likely related to acid reflux. Symptoms improved with omeprazole. - Continue omeprazole - Consider bed elevation or wedge pillow      Relevant Orders   Ambulatory referral to  Orthopedic Surgery   OSA (obstructive sleep apnea)   Needs new CPAP machine and possibly a new sleep study due to insurance changes. Previous CPAP is old and may not be effective. - Order sleep study      Relevant Medications   Semaglutide, 1 MG/DOSE, (OZEMPIC, 1 MG/DOSE,) 2 MG/1.5ML SOPN   Semaglutide, 2 MG/DOSE, 8 MG/3ML SOPN   Other Relevant Orders   Ambulatory referral to Neurology   Ambulatory referral to Orthopedic Surgery   Encounter for annual physical exam - Primary   CPE completed today. Review of HM activities and recommendations discussed and provided on AVS. Anticipatory guidance, diet, and exercise recommendations provided. Medications, allergies, and hx reviewed and updated as necessary. Orders placed as listed below.  Plan: - Labs ordered. Will make changes as necessary based on results.  - I will review these results and send recommendations via MyChart or a telephone call.  - F/U with CPE in 1 year or sooner for acute/chronic health needs as directed.        Relevant Orders   Ambulatory referral to Orthopedic Surgery   Hyperlipidemia associated with type 2 diabetes mellitus (HCC)   Chronic high cholesterol, likely familial. On rosuvastatin but levels remain elevated. - Continue rosuvastatin- dose increase  - Send prescription for rosuvastatin      Relevant Medications   Semaglutide, 1 MG/DOSE, (OZEMPIC, 1 MG/DOSE,) 2 MG/1.5ML SOPN   Semaglutide, 2 MG/DOSE, 8 MG/3ML SOPN   rosuvastatin (CRESTOR) 40 MG tablet  Other Relevant Orders   Ambulatory referral to Orthopedic Surgery   Right foot pain   Persistent pain and swelling in foot post-surgery with numbness and inability to bend the toe. Averse to nerve block due to potential for permanent numbness. - Refer for second opinion on foot surgery outcome      Type 2 diabetes mellitus with hyperglycemia, without long-term current use of insulin (HCC)   Relevant Medications   Semaglutide, 1 MG/DOSE, (OZEMPIC, 1  MG/DOSE,) 2 MG/1.5ML SOPN   Semaglutide, 2 MG/DOSE, 8 MG/3ML SOPN   rosuvastatin (CRESTOR) 40 MG tablet   Other Relevant Orders   Microalbumin / creatinine urine ratio   Ambulatory referral to Orthopedic Surgery   Body mass index (BMI) of 36.0-36.9 in adult   Relevant Orders   Ambulatory referral to Orthopedic Surgery   Vitamin D deficiency   Relevant Medications   Vitamin D, Ergocalciferol, (DRISDOL) 1.25 MG (50000 UNIT) CAPS capsule   Other Relevant Orders   Ambulatory referral to Orthopedic Surgery      Follow up plan: Return in about 4 months (around 11/15/2023).  NEXT PREVENTATIVE PHYSICAL DUE IN 1 YEAR.  PATIENT COUNSELING PROVIDED FOR ALL ADULT PATIENTS: A well balanced diet low in saturated fats, cholesterol, and moderation in carbohydrates.  This can be as simple as monitoring portion sizes and cutting back on sugary beverages such as soda and juice to start with.    Daily water consumption of at least 64 ounces.  Physical activity at least 180 minutes per week.  If just starting out, start 10 minutes a day and work your way up.   This can be as simple as taking the stairs instead of the elevator and walking 2-3 laps around the office  purposefully every day.   STD protection, partner selection, and regular testing if high risk.  Limited consumption of alcoholic beverages if alcohol is consumed. For men, I recommend no more than 14 alcoholic beverages per week, spread out throughout the week (max 2 per day). Avoid "binge" drinking or consuming large quantities of alcohol in one setting.  Please let me know if you feel you may need help with reduction or quitting alcohol consumption.   Avoidance of nicotine, if used. Please let me know if you feel you may need help with reduction or quitting nicotine use.   Daily mental health attention. This can be in the form of 5 minute daily meditation, prayer, journaling, yoga, reflection, etc.  Purposeful attention to your  emotions and mental state can significantly improve your overall wellbeing  and  Health.  Please know that I am here to help you with all of your health care goals and am happy to work with you to find a solution that works best for you.  The greatest advice I have received with any changes in life are to take it one step at a time, that even means if all you can focus on is the next 60 seconds, then do that and celebrate your victories.  With any changes in life, you will have set backs, and that is OK. The important thing to remember is, if you have a set back, it is not a failure, it is an opportunity to try again! Screening Testing Mammogram Every 1 -2 years based on history and risk factors Starting at age 60 Pap Smear Ages 21-39 every 3 years Ages 31-65 every 5 years with HPV testing More frequent testing may be required based on results and history Colon Cancer  Screening Every 1-10 years based on test performed, risk factors, and history Starting at age 12 Bone Density Screening Every 2-10 years based on history Starting at age 31 for women Recommendations for men differ based on medication usage, history, and risk factors AAA Screening One time ultrasound Men 43-71 years old who have every smoked Lung Cancer Screening Low Dose Lung CT every 12 months Age 46-80 years with a 30 pack-year smoking history who still smoke or who have quit within the last 15 years   Screening Labs Routine  Labs: Complete Blood Count (CBC), Complete Metabolic Panel (CMP), Cholesterol (Lipid Panel) Every 6-12 months based on history and medications May be recommended more frequently based on current conditions or previous results Hemoglobin A1c Lab Every 3-12 months based on history and previous results Starting at age 83 or earlier with diagnosis of diabetes, high cholesterol, BMI >26, and/or risk factors Frequent monitoring for patients with diabetes to ensure blood sugar control Thyroid Panel  (TSH) Every 6 months based on history, symptoms, and risk factors May be repeated more often if on medication HIV One time testing for all patients 63 and older May be repeated more frequently for patients with increased risk factors or exposure Hepatitis C One time testing for all patients 23 and older May be repeated more frequently for patients with increased risk factors or exposure Gonorrhea, Chlamydia Every 12 months for all sexually active persons 13-24 years Additional monitoring may be recommended for those who are considered high risk or who have symptoms Every 12 months for any woman on birth control, regardless of sexual activity PSA Men 56-47 years old with risk factors Additional screening may be recommended from age 53-69 based on risk factors, symptoms, and history  Vaccine Recommendations Tetanus Booster All adults every 10 years Flu Vaccine All patients 6 months and older every year COVID Vaccine All patients 12 years and older Initial dosing with booster May recommend additional booster based on age and health history HPV Vaccine 2 doses all patients age 10-26 Dosing may be considered for patients over 26 Shingles Vaccine (Shingrix) 2 doses all adults 55 years and older Pneumonia (Pneumovax 42) All adults 65 years and older May recommend earlier dosing based on health history One year apart from Prevnar 58 Pneumonia (Prevnar 11) All adults 65 years and older Dosed 1 year after Pneumovax 23 Pneumonia (Prevnar 20) One time alternative to the two dosing of 13 and 23 For all adults with initial dose of 23, 20 is recommended 1 year later For all adults with initial dose of 13, 23 is still recommended as second option 1 year later

## 2023-07-17 NOTE — Patient Instructions (Signed)
I have sent the referral for the sleep study.    I have sent the referral for your toe.  I increased the Ozempic dose, this one should be easier to get. Go to 0.5mg  with your next dose until you finish what you have.   I sent in Vitamin D for you.   I have increased the rosuvastatin for you to see if we can get your cholesterol a little better.

## 2023-07-17 NOTE — Assessment & Plan Note (Signed)
Sensation of something stuck in throat, likely related to acid reflux. Symptoms improved with omeprazole. - Continue omeprazole - Consider bed elevation or wedge pillow

## 2023-07-17 NOTE — Assessment & Plan Note (Signed)

## 2023-07-17 NOTE — Assessment & Plan Note (Signed)
Needs new CPAP machine and possibly a new sleep study due to insurance changes. Previous CPAP is old and may not be effective. - Order sleep study

## 2023-07-17 NOTE — Assessment & Plan Note (Signed)
Persistent pain and swelling in foot post-surgery with numbness and inability to bend the toe. Averse to nerve block due to potential for permanent numbness. - Refer for second opinion on foot surgery outcome

## 2023-07-17 NOTE — Assessment & Plan Note (Signed)
Chronic high cholesterol, likely familial. On rosuvastatin but levels remain elevated. - Continue rosuvastatin- dose increase  - Send prescription for rosuvastatin

## 2023-07-18 ENCOUNTER — Encounter: Payer: Self-pay | Admitting: Nurse Practitioner

## 2023-07-20 ENCOUNTER — Other Ambulatory Visit: Payer: Commercial Managed Care - PPO

## 2023-07-21 ENCOUNTER — Other Ambulatory Visit: Payer: Commercial Managed Care - PPO

## 2023-07-24 ENCOUNTER — Other Ambulatory Visit: Payer: Commercial Managed Care - PPO

## 2023-07-24 DIAGNOSIS — E1165 Type 2 diabetes mellitus with hyperglycemia: Secondary | ICD-10-CM

## 2023-07-25 LAB — MICROALBUMIN / CREATININE URINE RATIO
Creatinine, Urine: 204.9 mg/dL
Microalb/Creat Ratio: 2 mg/g{creat} (ref 0–29)
Microalbumin, Urine: 3.5 ug/mL

## 2023-08-03 ENCOUNTER — Ambulatory Visit: Payer: Commercial Managed Care - PPO | Admitting: Orthopedic Surgery

## 2023-08-03 ENCOUNTER — Other Ambulatory Visit (INDEPENDENT_AMBULATORY_CARE_PROVIDER_SITE_OTHER): Payer: Commercial Managed Care - PPO

## 2023-08-03 DIAGNOSIS — M79671 Pain in right foot: Secondary | ICD-10-CM

## 2023-08-03 DIAGNOSIS — M2021 Hallux rigidus, right foot: Secondary | ICD-10-CM

## 2023-08-07 ENCOUNTER — Encounter: Payer: Self-pay | Admitting: Orthopedic Surgery

## 2023-08-07 NOTE — Progress Notes (Signed)
 Office Visit Note   Patient: Cassidy Leach           Date of Birth: 1970-08-03           MRN: [id number] Visit Date: 08/03/2023              Requested by: Oris Camie BRAVO, NP [address] [place],  [place] [address] PCP: Oris Camie BRAVO, NP  Chief Complaint  Patient presents with   Right Foot - Pain      HPI: Patient is a 53 year old woman who is seen for initial evaluation for right foot pain.  Patient states she is status post bunion surgery and hardware was exchanged secondary to nerve impingement.  Patient complains of pain around the MTP joint of the great toe and swelling.  Patient complains of decreased range of motion of her great toe. Assessment & Plan: Visit Diagnoses:  1. Pain in right foot   2. Hallux rigidus, right foot     Plan: Recommended a stiff soled sneaker and a carbon plate to unload the great toe MTP joint.  Follow-up as needed if she has persistent MTP pain.  Follow-Up Instructions: No follow-ups on file.   Ortho Exam  Patient is alert, oriented, no adenopathy, well-dressed, normal affect, normal respiratory effort. Examination patient is ambulating in crocs she has no forefoot support.  She has a good dorsalis pedis pulse she has swelling around the great toe MTP joint.  The EHL tendon is intact.  With palpation patient is tender to palpation over the MTP joint with plantarflexion of 0 degrees dorsiflexion of 30 degrees and pain reproduced with attempted range of motion of the great toe.  Patient does not have active range of motion of her IP joint.  Imaging: No results found. No images are attached to the encounter.  Labs: Lab Results  Component Value Date   HGBA1C 6.1 (H) 07/07/2023   HGBA1C 5.6 04/14/2022   HGBA1C 6.2 (H) 11/22/2021     Lab Results  Component Value Date   ALBUMIN 4.3 07/07/2023   ALBUMIN 4.7 06/24/2022   ALBUMIN 4.4 11/22/2021    No results found for: MG Lab Results  Component Value Date   VD25OH 13.8 (L)  07/07/2023   VD25OH 30.4 05/06/2022   VD25OH 19.9 (L) 11/22/2021    No results found for: PREALBUMIN    Latest Ref Rng & Units 07/07/2023   11:34 AM 06/24/2022    9:08 AM 05/06/2022   11:35 AM  CBC EXTENDED  WBC 3.4 - 10.8 x10E3/uL 5.1  4.1  4.6   RBC 3.77 - 5.28 x10E6/uL 4.61  4.60  4.89   Hemoglobin 11.1 - 15.9 g/dL 86.6  86.7  86.1   HCT 34.0 - 46.6 % 40.9  40.7  43.2   Platelets 150 - 450 x10E3/uL 279  274  280   NEUT# 1.4 - 7.0 x10E3/uL 1.6  1.4  1.6   Lymph# 0.7 - 3.1 x10E3/uL 3.0  2.3  2.7      There is no height or weight on file to calculate BMI.  Orders:  Orders Placed This Encounter  Procedures   XR Foot 2 Views Right   No orders of the defined types were placed in this encounter.    Procedures: No procedures performed  Clinical Data: No additional findings.  ROS:  All other systems negative, except as noted in the HPI. Review of Systems  Objective: Vital Signs: There were no vitals taken for this visit.  Specialty Comments:  No specialty comments available.  PMFS History: Patient Active Problem List   Diagnosis Date Noted   Right foot pain 07/17/2023   Acute left lumbar radiculopathy 01/18/2023   Other specified menopausal and perimenopausal disorders 04/14/2022   Rectus diastasis 11/30/2021   Environmental and seasonal allergies 10/04/2021   Type 2 diabetes mellitus with hyperglycemia, without long-term current use of insulin  (HCC) 12/22/2020   Hyperlipidemia associated with type 2 diabetes mellitus (HCC) 12/22/2020   Nodule of finger of right hand 12/10/2020   Body mass index (BMI) of 36.0-36.9 in adult 11/10/2020   Encounter for annual physical exam 11/10/2020   Neuralgia, post-herpetic 07/03/2019   OSA (obstructive sleep apnea) 10/04/2017   Chronic constipation 03/30/2017   Gastroesophageal reflux disease without esophagitis 03/30/2017   Patellar malalignment syndrome 10/31/2015   Myalgia and myositis 09/15/2014   Nonallopathic  lesion of sacral region 09/15/2014   Segmental and somatic dysfunction of cervical region 09/15/2014   Asthma, mild intermittent 03/27/2014   Vitamin D  deficiency 03/27/2014   Osteoarthrosis, localized, primary, involving lower leg 11/27/2012   Past Medical History:  Diagnosis Date   Acute non-recurrent pansinusitis 11/15/2022   Allergy    Seasonal   Asthma    Bacterial URI 09/22/2022   Blister of great toe of right foot 12/12/2022   Burn of throat 08/20/2022   Changes in vision 09/02/2021   Diabetes mellitus (HCC)    Encounter to establish care 11/10/2020   Fall 04/14/2022   Generalized abdominal pain 06/24/2022   Hyperlipidemia    Hyperlipidemia 03/27/2014   Impaired fasting glucose 05/17/2017   Myalgia and myositis    Non-recurrent acute suppurative otitis media of right ear without spontaneous rupture of tympanic membrane 01/18/2023   Osteoarthrosis involving lower leg 11/27/2012   Subacute cough 10/12/2021    History reviewed. No pertinent family history.  Past Surgical History:  Procedure Laterality Date   ABDOMINAL HYSTERECTOMY     FOOT SURGERY     KNEE SURGERY     PANNICULECTOMY N/A 07/09/2021   Procedure: PANNICULECTOMY;  Surgeon: Elisabeth Craig RAMAN, MD;  Location: [facility];  Service: Plastics;  Laterality: N/A;   TUBAL LIGATION     Social History   Occupational History   Not on file  Tobacco Use   Smoking status: Never   Smokeless tobacco: Never  Vaping Use   Vaping status: Never Used  Substance and Sexual Activity   Alcohol use: Yes    Alcohol/week: 2.0 standard drinks of alcohol    Types: 2 Standard drinks or equivalent per week    Comment: social   Drug use: Never   Sexual activity: Not Currently    Partners: Male    Birth control/protection: Abstinence

## 2023-08-14 ENCOUNTER — Ambulatory Visit: Payer: Commercial Managed Care - PPO | Admitting: Neurology

## 2023-08-14 ENCOUNTER — Encounter: Payer: Self-pay | Admitting: Nurse Practitioner

## 2023-08-14 ENCOUNTER — Encounter: Payer: Self-pay | Admitting: Neurology

## 2023-08-14 ENCOUNTER — Other Ambulatory Visit: Payer: Self-pay

## 2023-08-14 VITALS — BP 123/84 | HR 85 | Ht 67.0 in | Wt 223.0 lb

## 2023-08-14 DIAGNOSIS — Z9884 Bariatric surgery status: Secondary | ICD-10-CM

## 2023-08-14 DIAGNOSIS — R351 Nocturia: Secondary | ICD-10-CM | POA: Diagnosis not present

## 2023-08-14 DIAGNOSIS — G4733 Obstructive sleep apnea (adult) (pediatric): Secondary | ICD-10-CM

## 2023-08-14 DIAGNOSIS — E66811 Obesity, class 1: Secondary | ICD-10-CM

## 2023-08-14 DIAGNOSIS — R519 Headache, unspecified: Secondary | ICD-10-CM | POA: Diagnosis not present

## 2023-08-14 DIAGNOSIS — Z1231 Encounter for screening mammogram for malignant neoplasm of breast: Secondary | ICD-10-CM

## 2023-08-14 NOTE — Patient Instructions (Signed)
It was nice to meet you today.   As discussed, we will proceed with a home sleep test (HST) to re-assess your sleep apnea and to see if you still qualify for CPAP or AutoPap therapy.  You should be eligible for a new machine. Our sleep lab staff will reach out to you to arrange for pickup and for tutorial of your test equipment - you will do the test at home that night and bring the test sensors back for data analysis the next day or whenever you are scheduled for drop off of your test equipment. I will write for a new machine after your HST confirms your obstructive sleep apnea diagnosis.   Please remember, you will not use your CPAP the night of your testing.  This is so we get diagnostic data, we do not need treatment data.  We will call you with the results.   After you have done your home sleep test, you can resume using your AutoPap machine or CPAP machine if you are currently using 1.  We will plan a follow-up after testing.   Please continue to work on your weight loss.

## 2023-08-14 NOTE — Progress Notes (Signed)
Subjective:    Patient ID: Cassidy Leach is a 53 y.o. female.  HPI    [name], MD, PhD [name] [address], [address] [address] [place], [place] [address]  Dear [name],  I saw your patient, Cassidy Leach, upon your kind request in my sleep clinic today for initial consultation of her sleep disorder, in particular, evaluation of her prior diagnosis of obstructive sleep apnea.  The patient is unaccompanied today.  As you know, [name] is a 53 year old female with an underlying medical history of diabetes, vitamin D deficiency, reflux disease, hyperlipidemia, allergies, asthma, and obesity with status post bariatric surgery, who reports snoring and excessive daytime somnolence, as well as witnessed apneas per daughter's observation.  She was previously diagnosed with obstructive sleep apnea and placed on PAP therapy.  She is currently no longer on PAP therapy.  Her Epworth sleepiness score is 5 out of 24, fatigue severity score is 34 out of 63.  Her machine is older, about 55 years old.  She had 2 sleep studies in the past 10 years, as she recalls, she had mild sleep apnea.  She was able to lose weight after her gastric sleeve some 6 years ago, this was when she was still residing in [place].  She has a son and adopted daughter in [place], she lives with her daughter and son-in-law and their 4 children.  She works in Clinical biochemist, she works from home.  She has not been on her PAP machine consistently for at least 2 years.  She had benefited from treatment in the past including better sleep quality, less daytime tiredness, less morning headaches.  She has morning headaches and nocturia about once per average night.  She has no pets at the house.  She drinks caffeine in the form of coffee, 1-1/2 cups in the mornings.  She drinks occasional soda, she is a non-smoker and drinks alcohol occasionally.  Bedtime is generally between 9 and 10 PM and rise time  between 5 and 6 AM.  She is not aware of any family history of sleep apnea.  She had a tonsillectomy and adenoidectomy. Prior sleep study results are not available for my review today.  I reviewed your office visit note from 07/17/2023.  A PAP compliance download is not available for my review today.  Her Past Medical History Is Significant For: Past Medical History:  Diagnosis Date   Acute non-recurrent pansinusitis 11/15/2022   Allergy    Seasonal   Asthma    Bacterial URI 09/22/2022   Blister of great toe of right foot 12/12/2022   Burn of throat 08/20/2022   Changes in vision 09/02/2021   Diabetes mellitus (HCC)    Encounter to establish care 11/10/2020   Fall 04/14/2022   Generalized abdominal pain 06/24/2022   Hyperlipidemia    Hyperlipidemia 03/27/2014   Impaired fasting glucose 05/17/2017   Myalgia and myositis    Non-recurrent acute suppurative otitis media of right ear without spontaneous rupture of tympanic membrane 01/18/2023   Osteoarthrosis involving lower leg 11/27/2012   Subacute cough 10/12/2021    Her Past Surgical History Is Significant For: Past Surgical History:  Procedure Laterality Date   ABDOMINAL HYSTERECTOMY     FOOT SURGERY     KNEE SURGERY     PANNICULECTOMY N/A 07/09/2021   Procedure: PANNICULECTOMY;  Surgeon: Allena Napoleon, MD;  Location: [facility];  Service: Plastics;  Laterality: N/A;   TUBAL LIGATION  Her Family History Is Significant For: Family History  Problem Relation Age of Onset   COPD Mother    Sleep apnea Neg Hx     Her Social History Is Significant For: Social History   Socioeconomic History   Marital status: Single    Spouse name: Not on file   Number of children: 3   Years of education: Not on file   Highest education level: 12th grade  Occupational History   Not on file  Tobacco Use   Smoking status: Never   Smokeless tobacco: Never  Vaping Use   Vaping status: Never Used  Substance and  Sexual Activity   Alcohol use: Yes    Alcohol/week: 2.0 standard drinks of alcohol    Types: 2 Standard drinks or equivalent per week    Comment: social   Drug use: Never   Sexual activity: Not Currently    Partners: Male    Birth control/protection: Abstinence, Surgical  Other Topics Concern   Not on file  Social History Narrative   Caffeine: 1.5 cup/day   Right handed   Caffeine: lives with her daughter, son-in-law and her  grandchildren   Social Drivers of Corporate investment banker Strain: Medium Risk (07/13/2023)   Overall Financial Resource Strain (CARDIA)    Difficulty of Paying Living Expenses: Somewhat hard  Food Insecurity: No Food Insecurity (07/13/2023)   Hunger Vital Sign    Worried About Running Out of Food in the Last Year: Never true    Ran Out of Food in the Last Year: Never true  Transportation Needs: No Transportation Needs (07/13/2023)   PRAPARE - Administrator, Civil Service (Medical): No    Lack of Transportation (Non-Medical): No  Physical Activity: Sufficiently Active (07/13/2023)   Exercise Vital Sign    Days of Exercise per Week: 5 days    Minutes of Exercise per Session: 40 min  Stress: No Stress Concern Present (07/13/2023)   [employer] of Occupational Health - Occupational Stress Questionnaire    Feeling of Stress : Only a little  Social Connections: Moderately Isolated (07/13/2023)   Social Connection and Isolation Panel [NHANES]    Frequency of Communication with Friends and Family: More than three times a week    Frequency of Social Gatherings with Friends and Family: More than three times a week    Attends Religious Services: Never    Database administrator or Organizations: Yes    Attends Engineer, structural: More than 4 times per year    Marital Status: Divorced    Her Allergies Are:  No Known Allergies:   Her Current Medications Are:  Outpatient Encounter Medications as of 08/14/2023  Medication Sig    albuterol (VENTOLIN HFA) 108 (90 Base) MCG/ACT inhaler Inhale 2 puffs into the lungs every 6 (six) hours as needed for wheezing.   azelastine (ASTELIN) 0.1 % nasal spray Place 2 sprays into both nostrils 2 (two) times daily. Use in each nostril as directed   blood glucose meter kit and supplies Monitor blood sugar every morning before eating or drinking and write on log. May monitor at additional times during the day if having symptoms.   Blood Glucose Monitoring Suppl DEVI 1 each by Does not apply route in the morning, at noon, and at bedtime. May substitute to any manufacturer covered by patient's insurance. (One touch ultra, if available).   dicyclomine (BENTYL) 10 MG capsule Take 1 capsule (10 mg total) by mouth 4 (four)  times daily -  before meals and at bedtime. (Patient taking differently: Take 10 mg by mouth 4 (four) times daily -  before meals and at bedtime. As needed)   fluticasone (FLONASE) 50 MCG/ACT nasal spray Place into both nostrils. As needed   gabapentin (NEURONTIN) 300 MG capsule Take 1 capsule (300 mg total) by mouth 3 (three) times daily. (Patient taking differently: Take 300 mg by mouth 3 (three) times daily. As needed)   Lancets (ONETOUCH DELICA PLUS LANCET33G) MISC Use to test blood sugar every morning   levocetirizine (XYZAL) 5 MG tablet Take 1 tablet (5 mg total) by mouth every evening. (Patient taking differently: Take 5 mg by mouth every evening. As needed)   omeprazole (PRILOSEC) 40 MG capsule Take 1 capsule (40 mg total) by mouth daily. (Patient taking differently: Take 40 mg by mouth daily. As needed)   rosuvastatin (CRESTOR) 40 MG tablet Take 1 tablet (40 mg total) by mouth daily.   Semaglutide, 2 MG/DOSE, 8 MG/3ML SOPN Inject 2 mg as directed once a week.   Vitamin D, Ergocalciferol, (DRISDOL) 1.25 MG (50000 UNIT) CAPS capsule Take 1 capsule (50,000 Units total) by mouth every 7 (seven) days.   Continuous Blood Gluc Receiver (DEXCOM G6 RECEIVER) DEVI Use as directed  (Patient not taking: Reported on 08/14/2023)   Continuous Blood Gluc Transmit (DEXCOM G6 TRANSMITTER) MISC Use as directed (Patient not taking: Reported on 08/14/2023)   cyclobenzaprine (FLEXERIL) 10 MG tablet Take 1 tablet (10 mg total) by mouth at bedtime. (Patient not taking: Reported on 08/14/2023)   nystatin (MYCOSTATIN) 100000 UNIT/ML suspension Take 2 mLs (200,000 Units total) by mouth 4 (four) times daily. (Patient not taking: Reported on 08/14/2023)   Semaglutide, 1 MG/DOSE, (OZEMPIC, 1 MG/DOSE,) 2 MG/1.5ML SOPN Inject 1 mg into the skin once a week. (Patient not taking: Reported on 08/14/2023)   No facility-administered encounter medications on file as of 08/14/2023.  :   Review of Systems:  Out of a complete 14 point review of systems, all are reviewed and negative with the exception of these symptoms as listed below:   Review of Systems  Neurological:        Patient is here alone for referral to establish care for OSA. She has a cpap machine that is over 88 years old. She can't get parts for it anymore and it doesn't really work. She did not bring it with her. ESS 5 FSS 34.    Objective:  Neurological Exam  Physical Exam Physical Examination:   Vitals:   08/14/23 1354  BP: 123/84  Pulse: 85    General Examination: The patient is a very pleasant 53 y.o. female in no acute distress. She appears well-developed and well-nourished and well groomed.   HEENT: Normocephalic, atraumatic, pupils are equal, round and reactive to light, extraocular tracking is good without limitation to gaze excursion or nystagmus noted. Hearing is grossly intact. Face is symmetric with normal facial animation. Speech is clear with no dysarthria noted. There is no hypophonia. There is no lip, neck/head, jaw or voice tremor. Neck is supple with full range of passive and active motion. There are no carotid bruits on auscultation. Oropharynx exam reveals: No significant mouth dryness, good dental hygiene, mild  airway crowding secondary to small airway entry, Mallampati class II.  Neck circumference 16-1/4 inches, minimal overbite noted.  Tonsils absent.  Tongue protrudes centrally and palate elevates symmetrically.   Chest: Clear to auscultation without wheezing, rhonchi or crackles noted.  Heart: S1+S2+0, regular  and normal without murmurs, rubs or gallops noted.   Abdomen: Soft, non-tender and non-distended.  Extremities: There is no pitting edema in the distal lower extremities bilaterally.   Skin: Warm and dry without trophic changes noted.   Musculoskeletal: exam reveals no obvious joint deformities.   Neurologically:  Mental status: The patient is awake, alert and oriented in all 4 spheres. Her immediate and remote memory, attention, language skills and fund of knowledge are appropriate. There is no evidence of aphasia, agnosia, apraxia or anomia. Speech is clear with normal prosody and enunciation. Thought process is linear. Mood is normal and affect is normal.  Cranial nerves II - XII are as described above under HEENT exam.  Motor exam: Normal bulk, strength and tone is noted. There is no obvious action or resting tremor.  Fine motor skills and coordination: grossly intact.  Cerebellar testing: No dysmetria or intention tremor. There is no truncal or gait ataxia.  Sensory exam: intact to light touch in the upper and lower extremities.  Gait, station and balance: She stands easily. No veering to one side is noted. No leaning to one side is noted. Posture is age-appropriate and stance is narrow based. Gait shows normal stride length and normal pace. No problems turning are noted.   Assessment and Plan:  In summary, LONEY BALRAM is a very pleasant 53 y.o.-year old female with an underlying medical history of diabetes, vitamin D deficiency, reflux disease, hyperlipidemia, allergies, asthma, and obesity with status post bariatric surgery, who presents for evaluation of her obstructive  sleep apnea.  She was originally diagnosed approximately 10 years ago.  She had bariatric surgery some 6 years ago and has remained fairly stable with her weight but feels that her sleep apnea symptoms have become worse in the past year or 2.  While a laboratory attended sleep study is typically considered "gold standard" for evaluation of sleep disordered breathing, we mutually agreed to proceed with a home sleep test at this time.   I had a long chat with the patient about my findings and the diagnosis of sleep apnea, particularly OSA, its prognosis and treatment options. We talked about medical/conservative treatments, surgical interventions and non-pharmacological approaches for symptom control. I explained, in particular, the risks and ramifications of untreated moderate to severe OSA, especially with respect to developing cardiovascular disease down the road, including congestive heart failure (CHF), difficult to treat hypertension, cardiac arrhythmias (particularly A-fib), neurovascular complications including TIA, stroke and dementia. Even type 2 diabetes has, in part, been linked to untreated OSA. Symptoms of untreated OSA may include (but may not be limited to) daytime sleepiness, nocturia (i.e. frequent nighttime urination), memory problems, mood irritability and suboptimally controlled or worsening mood disorder such as depression and/or anxiety, lack of energy, lack of motivation, physical discomfort, as well as recurrent headaches, especially morning or nocturnal headaches. We talked about the importance of maintaining a healthy lifestyle and striving for healthy weight.  I recommended a sleep study at this time. I outlined the differences between a laboratory attended sleep study which is considered more comprehensive and accurate over the option of a home sleep test (HST); the latter may lead to underestimation of sleep disordered breathing in some instances and does not help with diagnosing upper  airway resistance syndrome and is not accurate enough to diagnose primary central sleep apnea typically. I outlined possible surgical and non-surgical treatment options of OSA, including the use of a positive airway pressure (PAP) device (i.e. CPAP, AutoPAP/APAP or BiPAP  in certain circumstances), a custom-made dental device (aka oral appliance, which would require a referral to a specialist dentist or orthodontist typically, and is generally speaking not considered for patients with full dentures or edentulous state), upper airway surgical options, such as traditional UPPP (which is not considered a first-line treatment) or the Inspire device (hypoglossal nerve stimulator, which would involve a referral for consultation with an ENT surgeon, after careful selection, following inclusion criteria - also not first-line treatment). I explained the PAP treatment option to the patient in detail, as this is generally considered first-line treatment.  The patient indicated that she would be willing to try PAP therapy again, if the need arises. I explained the importance of being compliant with PAP treatment, not only for insurance purposes but primarily to improve patient's symptoms symptoms, and for the patient's long term health benefit, including to reduce Her cardiovascular risks longer-term.    We will pick up our discussion about the next steps and treatment options after testing.  We will keep her posted as to the test results by phone call and/or MyChart messaging where possible.  We will plan to follow-up in sleep clinic accordingly as well.  I answered all her questions today and the patient was in agreement.   I encouraged her to call with any interim questions, concerns, problems or updates or email [place] through MyChart.  Generally speaking, sleep test authorizations may take up to 2 weeks, sometimes less, sometimes longer, the patient is encouraged to get in touch with [place] if they do not hear back from the  sleep lab staff directly within the next 2 weeks.  Thank you very much for allowing me to participate in the care of this nice patient. If I can be of any further assistance to you please do not hesitate to call me at [phone].  Sincerely,   [name], MD, PhD

## 2023-08-24 ENCOUNTER — Ambulatory Visit: Payer: Commercial Managed Care - PPO | Admitting: Neurology

## 2023-08-24 DIAGNOSIS — G4733 Obstructive sleep apnea (adult) (pediatric): Secondary | ICD-10-CM

## 2023-08-24 DIAGNOSIS — R519 Headache, unspecified: Secondary | ICD-10-CM

## 2023-08-24 DIAGNOSIS — Z9884 Bariatric surgery status: Secondary | ICD-10-CM

## 2023-08-24 DIAGNOSIS — R351 Nocturia: Secondary | ICD-10-CM

## 2023-08-24 DIAGNOSIS — G4734 Idiopathic sleep related nonobstructive alveolar hypoventilation: Secondary | ICD-10-CM

## 2023-08-24 DIAGNOSIS — E66811 Obesity, class 1: Secondary | ICD-10-CM

## 2023-08-29 ENCOUNTER — Encounter: Payer: Self-pay | Admitting: Neurology

## 2023-08-30 ENCOUNTER — Ambulatory Visit
Admission: RE | Admit: 2023-08-30 | Discharge: 2023-08-30 | Disposition: A | Discharge status: Home / Self Care | Payer: Commercial Managed Care - PPO | Source: Ambulatory Visit | Attending: Nurse Practitioner | Admitting: Nurse Practitioner

## 2023-08-30 DIAGNOSIS — Z1231 Encounter for screening mammogram for malignant neoplasm of breast: Secondary | ICD-10-CM

## 2023-09-04 ENCOUNTER — Encounter: Payer: Self-pay | Admitting: Nurse Practitioner

## 2023-09-06 NOTE — Progress Notes (Unsigned)
Marland Kitchen

## 2023-09-07 NOTE — Addendum Note (Signed)
Addended by: [name] on: 09/07/2023 06:04 PM   Modules accepted: Orders

## 2023-09-07 NOTE — Procedures (Signed)
[facility]  HOME SLEEP TEST (SANSA) REPORT (Mail-Out Device):   STUDY DATE: 08/25/2023  DOB: 02/03/1971  MRN: [id number]  ORDERING CLINICIAN: [name], MD, PhD   REFERRING CLINICIAN: Early, Sung Amabile, NP   CLINICAL INFORMATION/HISTORY: 53 year old female with an underlying medical history of diabetes, vitamin D deficiency, reflux disease, hyperlipidemia, allergies, asthma, and obesity with status post bariatric surgery, who reports snoring and excessive daytime somnolence, as well as witnessed apneas.  She was previously diagnosed with obstructive sleep apnea and placed on PAP therapy, but is no longer on PAP therapy.   Leach'S LAST REPORTED EPWORTH SLEEPINESS SCORE (ESS): 5/24.  BMI (at Cassidy time of sleep clinic visit and/or test date): 34.9 kg/m  FINDINGS:   Study Protocol:    Cassidy SANSA single-point-of-skin-contact chest-worn sensor - an FDA cleared and DOT approved type 4 home sleep test device - measures eight physiological channels,  including blood oxygen saturation (measured via PPG [photoplethysmography]), EKG-derived heart rate, respiratory effort, chest movement (measured via accelerometer), snoring, body position, and actigraphy. Cassidy device is designed to be worn for up to 10 hours per study.   Sleep Summary:   Total Recording Time (hours, min): 9 hours, 0 min  Total Effective Sleep Time (hours, min):  7 hours, 47 min  Sleep Efficiency (%):    92%   Respiratory Indices:   Calculated sAHI (per hour):  23/hour         Oxygen Saturation Statistics:    Oxygen Saturation (%) Mean: 93.3%   Minimum oxygen saturation (%):                 71%   O2 Saturation Range (%): 71-99.7%   Time below or at 88% saturation: 16 min   Pulse Rate Statistics:   Pulse Mean (bpm):    80/min    Pulse Range (64 - 106/min)   Snoring: Mild to moderate, at times louder  IMPRESSION/DIAGNOSES:   OSA (obstructive sleep apnea), moderate  Nocturnal  Hypoxemia  RECOMMENDATIONS:   This home sleep test demonstrates moderate obstructive sleep apnea with a total AHI of 23/hour and O2 nadir of 71%.  Variable snoring was detected, ranging from mild to louder.  Time below or at 88% saturation was over 15 minutes for Cassidy study, indicating nocturnal hypoxemia.  Treatment with a positive airway pressure (PAP) device is recommended. Cassidy Leach will be advised to proceed with an autoPAP titration/trial at home for now. A full night titration study may be considered to optimize treatment settings, monitor proper oxygen saturations and aid with improvement of tolerance and adherence, if needed down Cassidy road. Alternative treatment options may include a dental device through dentistry or orthodontics in selected patients or Inspire (hypoglossal nerve stimulator) in carefully selected patients (meeting inclusion criteria).  Concomitant weight loss is recommended (where clinically appropriate). Please note that untreated obstructive sleep apnea may carry additional perioperative morbidity. Patients with significant obstructive sleep apnea should receive perioperative PAP therapy and Cassidy surgeons and particularly Cassidy anesthesiologist should be informed of Cassidy diagnosis and Cassidy severity of Cassidy sleep disordered breathing. Cassidy Leach should be cautioned not to drive, work at heights, or operate dangerous or heavy equipment when tired or sleepy. Review and reiteration of good sleep hygiene measures should be pursued with any Leach. Other causes of Cassidy Leach's symptoms, including circadian rhythm disturbances, an underlying mood disorder, medication effect and/or an underlying medical problem cannot be ruled out based on this test. Clinical correlation is recommended.  Cassidy Leach  and her referring provider will be notified of Cassidy test results. Cassidy Leach will be seen in follow up in sleep clinic at [facility].  I certify that I have reviewed Cassidy raw data recording prior to  Cassidy issuance of this report in accordance with Cassidy standards of Cassidy American Academy of Sleep Medicine (AASM).   INTERPRETING PHYSICIAN:   [name], MD, PhD Medical Director, [facility]) Diplomat, ABPN (Neurology and Sleep)   [facility] [address], [address] [place], [place] [address] [phone]

## 2023-09-11 ENCOUNTER — Encounter: Payer: Self-pay | Admitting: Nurse Practitioner

## 2023-09-11 ENCOUNTER — Telehealth: Payer: Self-pay

## 2023-09-11 NOTE — Telephone Encounter (Signed)
Called patient and informed her per Dr Frances Furbish "Please call and notify the patient that the recent home sleep test showed obstructive sleep apnea in the moderate range. I recommend treatment in the form of autoPAP, which means, that we don't have to bring her in for a sleep study with CPAP, but will let her start using a so called autoPAP machine at home, which is a CPAP-like machine with self-adjusting pressures. We will send the order to a local DME company (of her choice, or as per insurance requirement). The DME representative will fit her with a mask, educate her on how to use the machine, how to put the mask on, etc. I have placed an order in the chart. Please send the order, talk to patient, send report to referring MD. We will need a FU in sleep clinic for 10 weeks post-PAP set up, please arrange that with me or one of our NPs. Also reinforce the need for compliance with treatment." Pt verbalized understanding. Pt had no questions at this time but was encouraged to call back if questions arise.

## 2023-09-11 NOTE — Telephone Encounter (Signed)
-----   Message from [name] sent at 09/07/2023  6:04 PM EST ----- Patient referred by PCP NP, seen by me on 08/14/2023, patient had a HST on 08/25/2023.    Please call and notify the patient that the recent home sleep test showed obstructive sleep apnea in the moderate range. I recommend treatment in the form of autoPAP, which means, that we don't have to bring her in for a sleep study with CPAP, but will let her start using a so called autoPAP machine at home, which is a CPAP-like machine with self-adjusting pressures. We will send the order to a local DME company (of her choice, or as per insurance requirement). The DME representative will fit her with a mask, educate her on how to use the machine, how to put the mask on, etc. I have placed an order in the chart. Please send the order, talk to patient, send report to referring MD. We will need a FU in sleep clinic for 10 weeks post-PAP set up, please arrange that with me or one of our NPs. Also reinforce the need for compliance with treatment. Thanks,   [name], MD, PhD [facility])

## 2023-09-12 ENCOUNTER — Other Ambulatory Visit: Payer: Self-pay

## 2023-09-12 MED ORDER — DEXCOM G7 SENSOR MISC: 1.0000 | 2 refills | Status: DC

## 2023-09-12 MED ORDER — DEXCOM G7 RECEIVER DEVI: 1.0000 | Freq: Once | 0 refills | Status: AC

## 2023-09-12 NOTE — Telephone Encounter (Signed)
Patient Sleeping orders has been sent to [facility], yesterday.

## 2023-09-18 ENCOUNTER — Encounter: Payer: Self-pay | Admitting: Nurse Practitioner

## 2023-09-19 ENCOUNTER — Telehealth: Payer: Commercial Managed Care - PPO | Admitting: Nurse Practitioner

## 2023-09-19 VITALS — Wt 223.0 lb

## 2023-09-19 DIAGNOSIS — J4521 Mild intermittent asthma with (acute) exacerbation: Secondary | ICD-10-CM | POA: Diagnosis not present

## 2023-09-19 DIAGNOSIS — R052 Subacute cough: Secondary | ICD-10-CM

## 2023-09-19 MED ORDER — PROMETHAZINE-DM 6.25-15 MG/5ML PO SYRP
5.0000 mL | ORAL_SOLUTION | Freq: Four times a day (QID) | ORAL | 0 refills | Status: DC | PRN
Start: 2023-09-19 — End: 2023-10-06

## 2023-09-19 MED ORDER — AMOXICILLIN-POT CLAVULANATE 875-125 MG PO TABS
1.0000 | ORAL_TABLET | Freq: Two times a day (BID) | ORAL | 0 refills | Status: DC
Start: 2023-09-19 — End: 2023-10-06

## 2023-09-19 MED ORDER — PREDNISONE 20 MG PO TABS: ORAL_TABLET | ORAL | 0 refills | Status: DC

## 2023-09-19 NOTE — Progress Notes (Unsigned)
 Virtual Visit Encounter {telephone/mychart:25033} visit.   I connected with  Cassidy Leach on 09/19/23 at  3:30 PM EST by secure {Video Enabled:26378::"video and audio"} telemedicine application. I verified that I am speaking with the correct person using two identifiers.   I introduced myself as a Publishing rights manager with the practice. The limitations of evaluation and management by telemedicine discussed with the patient and the availability of in person appointments. The patient expressed verbal understanding and consent to proceed.  Participating parties in this visit include: Myself and {Relatives of adult:5061}  The patient is: {Patient Location:[phone]::"Home"} I am: {Provider [phone]::"Home Office"} Subjective:    CC and HPI: Cassidy Leach is a 53 y.o. year old female presenting for {New/followup:15353} ***. Patient reports the following:  Past medical history, Surgical history, Family history not pertinant except as noted below, Social history, Allergies, and medications have been entered into the medical record, reviewed, and corrections made.   Review of Systems:  All review of systems negative except what is listed in the HPI  Objective:    Alert and oriented x *** Speaking in clear sentences with no shortness of breath. No distress.  Impression and Recommendations:    Problem List Items Addressed This Visit   None   {disease follow up plans:315751} I discussed the assessment and treatment plan with the patient. The patient was provided an opportunity to ask questions and all were answered. The patient agreed with the plan and demonstrated an understanding of the instructions.   The patient was advised to call back or seek an in-person evaluation if the symptoms worsen or if the condition fails to improve as anticipated.  Follow-Up: {plan; follow-up:11812}  I provided *** minutes of non-face-to-face interaction with this non face-to-face  encounter including intake, same-day documentation, and chart review.   [name], NP , DNP, AGNP-c [facility]

## 2023-09-20 ENCOUNTER — Encounter: Payer: Self-pay | Admitting: Nurse Practitioner

## 2023-09-20 NOTE — Assessment & Plan Note (Signed)
 Asthma exacerbated by current respiratory infection, increased inhaler use, especially at night. - Ensure adequate supply of inhaler - Monitor and adjust asthma treatment as necessary

## 2023-09-20 NOTE — Assessment & Plan Note (Signed)
 Persistent productive cough for 1.5 weeks, worsening at night, disrupting sleep. Clear mucus, hoarseness, and shortness of breath, especially at night. Concern for community-acquired pneumonia. Previous adverse reactions to codeine and hydrocodone in cough syrup. - Prescribe prednisone with a tapering dose (5-4-3-2-1) - Prescribe an antibiotic - Prescribe a non-codeine, non-hydrocodone cough syrup for nighttime use - Advise rest and hydration - Follow up if cough persists post-antibiotics

## 2023-09-26 ENCOUNTER — Telehealth: Payer: Self-pay

## 2023-09-26 ENCOUNTER — Other Ambulatory Visit (HOSPITAL_COMMUNITY): Payer: Self-pay

## 2023-09-26 NOTE — Telephone Encounter (Signed)
 Pharmacy Patient Advocate Encounter  Received notification from  [facility] ([employer])  that Prior Authorization for Dexcom G7 Sensor has been APPROVED from 3.4.25 to 3.4.26. Ran test claim, Copay is $60.00. This test claim was processed through [facility]- copay amounts may vary at other pharmacies due to pharmacy/plan contracts, or as the patient moves through the different stages of their insurance plan.

## 2023-09-26 NOTE — Telephone Encounter (Signed)
 Pharmacy Patient Advocate Encounter   Received notification from Physician's Office that prior authorization for Dexcom G7 Sensor is required/requested.   Insurance verification completed.   The patient is insured through  [employer] ([employer])  .   Per test claim: PA required; PA submitted to above mentioned insurance via CoverMyMeds Key/confirmation #/EOC (Key: [id number])        Status is pending

## 2023-10-04 NOTE — Telephone Encounter (Signed)
 I called patient and moved her appt up to March 18th for follow up.

## 2023-10-05 NOTE — Telephone Encounter (Signed)
 It looks like she is scheduled for the 18th, but can you reach out to her to see if she would like me to put her on the schedule for Friday afternoon?  Please confirm what medication she is taking for her diabetes.

## 2023-10-06 ENCOUNTER — Ambulatory Visit: Admitting: Nurse Practitioner

## 2023-10-06 ENCOUNTER — Encounter: Payer: Self-pay | Admitting: Nurse Practitioner

## 2023-10-06 VITALS — BP 126/82 | HR 84 | Wt 213.0 lb

## 2023-10-06 DIAGNOSIS — E1165 Type 2 diabetes mellitus with hyperglycemia: Secondary | ICD-10-CM | POA: Diagnosis not present

## 2023-10-06 DIAGNOSIS — E1169 Type 2 diabetes mellitus with other specified complication: Secondary | ICD-10-CM

## 2023-10-06 DIAGNOSIS — E785 Hyperlipidemia, unspecified: Secondary | ICD-10-CM | POA: Diagnosis not present

## 2023-10-06 DIAGNOSIS — G4733 Obstructive sleep apnea (adult) (pediatric): Secondary | ICD-10-CM | POA: Diagnosis not present

## 2023-10-06 LAB — POCT GLYCOSYLATED HEMOGLOBIN (HGB A1C): Hemoglobin A1C: 5.8 % — AB (ref 4.0–5.6)

## 2023-10-06 NOTE — Assessment & Plan Note (Signed)
 Ongoing issues with obstructive sleep apnea, contributing to morning headaches and potentially affecting blood sugar control. Awaiting CPAP machine delivery, delayed by supplier. Sleep apnea can exacerbate blood sugar fluctuations and overall health. - Contact CPAP supplier to expedite delivery of the CPAP machine. - Encourage follow-up with supplier and insurance for authorization.

## 2023-10-06 NOTE — Assessment & Plan Note (Signed)
 Experiencing episodes of rebound hyperglycemia with blood sugar spikes following carbohydrate-heavy meals, such as chicken pot pie, due to an imbalance of carbohydrates and proteins. A1c is 5.8, indicating overall good glycemic control. Recent prednisone use may have contributed to temporary blood sugar elevations. Currently on Ozempic, which helps maintain blood sugar levels. Considering switching to [facility] for potentially better control and improved glycemic stability. - Monitor blood glucose levels using Dexcom. - Encourage small, frequent meals to prevent large glucose fluctuations. - Advise drinking water and walking if blood sugar spikes occur. - Consider switching to [facility] if blood sugar fluctuations persist. - Reassess in one week if blood sugar levels do not stabilize.

## 2023-10-06 NOTE — Progress Notes (Signed)
 [name], DNP, AGNP-c [facility] [address] [place], [place] [address] [phone]   ACUTE VISIT- ESTABLISHED PATIENT  Blood pressure 126/82, pulse 84, weight 213 lb (96.6 kg).  Subjective:  HPI Cassidy Leach is a 53 y.o. female presents to day for evaluation of acute concern(s).   History of Present Illness Cassidy Leach is a 53 year old female with diabetes who presents with fluctuating blood sugar levels.  She experiences erratic blood sugar levels, with episodes of hyperglycemia reaching over 200 mg/dL, even when not eating, such as when sitting at her desk drinking water. She describes episodes where her blood sugar spikes and then drops drastically, which she associates with consuming meals high in carbohydrates, such as chicken pot pie.  She has a history of using metformin but currently relies on Ozempic for blood sugar control. Her blood sugar levels have been more stable since taking Ozempic, with readings between 90 and 120 mg/dL, although she still experiences occasional spikes. She takes Ozempic 2 mg weekly, typically on Thursday nights. She recently took prednisone, which she believes may have contributed to her recent blood sugar fluctuations. Her A1c is reported to be 5.8%.  She mentions experiencing symptoms such as headaches and vision changes, which she attributes to her fluctuating blood sugar levels.  She is also dealing with sleep apnea and is in the process of obtaining a CPAP machine, which has been delayed due to issues with the supplier. She reports waking up with headaches and believes her sleep apnea is affecting her blood sugar levels.  ROS negative except for what is listed in HPI. History, Medications, Surgery, SDOH, and Family History reviewed and updated as appropriate.  Objective:  Physical Exam Vitals and nursing note reviewed.  HENT:     Head: Normocephalic.  Eyes:     Conjunctiva/sclera: Conjunctivae normal.   Cardiovascular:     Rate and Rhythm: Normal rate and regular rhythm.     Pulses: Normal pulses.     Heart sounds: Normal heart sounds.  Pulmonary:     Effort: Pulmonary effort is normal.     Breath sounds: Normal breath sounds.  Skin:    Capillary Refill: Capillary refill takes 2 to 3 seconds.  Neurological:     Mental Status: She is alert and oriented to person, place, and time.         Assessment & Plan:   Problem List Items Addressed This Visit     OSA (obstructive sleep apnea)   Ongoing issues with obstructive sleep apnea, contributing to morning headaches and potentially affecting blood sugar control. Awaiting CPAP machine delivery, delayed by supplier. Sleep apnea can exacerbate blood sugar fluctuations and overall health. - Contact CPAP supplier to expedite delivery of the CPAP machine. - Encourage follow-up with supplier and insurance for authorization.       Type 2 diabetes mellitus with hyperglycemia, without long-term current use of insulin (HCC) - Primary   Experiencing episodes of rebound hyperglycemia with blood sugar spikes following carbohydrate-heavy meals, such as chicken pot pie, due to an imbalance of carbohydrates and proteins. A1c is 5.8, indicating overall good glycemic control. Recent prednisone use may have contributed to temporary blood sugar elevations. Currently on Ozempic, which helps maintain blood sugar levels. Considering switching to [facility] for potentially better control and improved glycemic stability. - Monitor blood glucose levels using Dexcom. - Encourage small, frequent meals to prevent large glucose fluctuations. - Advise drinking water and walking if blood sugar spikes occur. -  Consider switching to [facility] if blood sugar fluctuations persist. - Reassess in one week if blood sugar levels do not stabilize.      Relevant Orders   HgB A1c (Completed)   Hyperlipidemia associated with type 2 diabetes mellitus (HCC)      [name],  DNP, AGNP-c

## 2023-10-08 ENCOUNTER — Encounter: Payer: Self-pay | Admitting: Nurse Practitioner

## 2023-10-10 ENCOUNTER — Ambulatory Visit: Admitting: Nurse Practitioner

## 2023-10-10 ENCOUNTER — Encounter: Payer: Self-pay | Admitting: Nurse Practitioner

## 2023-10-14 ENCOUNTER — Other Ambulatory Visit (HOSPITAL_BASED_OUTPATIENT_CLINIC_OR_DEPARTMENT_OTHER): Payer: Self-pay | Admitting: Nurse Practitioner

## 2023-10-16 ENCOUNTER — Telehealth: Payer: Self-pay | Admitting: Nurse Practitioner

## 2023-10-16 ENCOUNTER — Encounter: Payer: Self-pay | Admitting: Neurology

## 2023-10-16 ENCOUNTER — Other Ambulatory Visit: Payer: Self-pay

## 2023-10-16 DIAGNOSIS — J3089 Other allergic rhinitis: Secondary | ICD-10-CM

## 2023-10-16 MED ORDER — LEVOCETIRIZINE DIHYDROCHLORIDE 5 MG PO TABS: 5.0000 mg | ORAL_TABLET | Freq: Every evening | ORAL | 1 refills | Status: AC

## 2023-10-16 NOTE — Telephone Encounter (Signed)
 Advocate [employer] order  Levocetirizine 5 mg

## 2023-10-16 NOTE — Telephone Encounter (Signed)
 Spoke with Mitch @ [employer]. He is going to have the team schedule patient asap.

## 2023-10-22 ENCOUNTER — Telehealth: Admitting: Nurse Practitioner

## 2023-10-22 DIAGNOSIS — B001 Herpesviral vesicular dermatitis: Secondary | ICD-10-CM | POA: Diagnosis not present

## 2023-10-22 DIAGNOSIS — H109 Unspecified conjunctivitis: Secondary | ICD-10-CM

## 2023-10-22 MED ORDER — POLYMYXIN B-TRIMETHOPRIM 10000-0.1 UNIT/ML-% OP SOLN: 1.0000 [drp] | OPHTHALMIC | 0 refills | Status: DC

## 2023-10-22 MED ORDER — VALACYCLOVIR HCL 1 G PO TABS
2000.0000 mg | ORAL_TABLET | Freq: Two times a day (BID) | ORAL | 0 refills | Status: AC
Start: 2023-10-22 — End: 2023-10-23

## 2023-10-22 NOTE — Patient Instructions (Signed)
 Polo Riley, thank you for joining Claiborne Rigg, NP for today's virtual visit.  While this provider is not your primary care provider (PCP), if your PCP is located in our provider database this encounter information will be shared with them immediately following your visit.   A [facility] MyChart account gives you access to today's visit and all your visits, tests, and labs performed at [facility] " click here if you don't have a  MyChart account or go to [email or web address]  Consent: (Patient) Cassidy Leach provided verbal consent for this virtual visit at the beginning of the encounter.  Current Medications:  Current Outpatient Medications:    trimethoprim-polymyxin b (POLYTRIM) ophthalmic solution, Place 1 drop into the left eye every 4 (four) hours., Disp: 10 mL, Rfl: 0   valACYclovir (VALTREX) 1000 MG tablet, Take 2 tablets (2,000 mg total) by mouth 2 (two) times daily for 1 day., Disp: 4 tablet, Rfl: 0   albuterol (VENTOLIN HFA) 108 (90 Base) MCG/ACT inhaler, Inhale 2 puffs into the lungs every 6 (six) hours as needed for wheezing., Disp: 18 g, Rfl: 5   azelastine (ASTELIN) 0.1 % nasal spray, Place 2 sprays into both nostrils 2 (two) times daily. Use in each nostril as directed, Disp: 30 mL, Rfl: 0   blood glucose meter kit and supplies, Monitor blood sugar every morning before eating or drinking and write on log. May monitor at additional times during the day if having symptoms., Disp: 1 each, Rfl: 99   Blood Glucose Monitoring Suppl DEVI, 1 each by Does not apply route in the morning, at noon, and at bedtime. May substitute to any manufacturer covered by patient's insurance. (One touch ultra, if available)., Disp: 1 each, Rfl: 0   fluticasone (FLONASE) 50 MCG/ACT nasal spray, Place into both nostrils. As needed, Disp: , Rfl:    Lancets (ONETOUCH DELICA PLUS LANCET33G) MISC, Use to test blood sugar every morning, Disp: 100 each, Rfl: 11    levocetirizine (XYZAL) 5 MG tablet, Take 1 tablet (5 mg total) by mouth every evening., Disp: 90 tablet, Rfl: 1   omeprazole (PRILOSEC) 40 MG capsule, Take 1 capsule by mouth daily., Disp: 90 capsule, Rfl: 1   rosuvastatin (CRESTOR) 40 MG tablet, Take 1 tablet (40 mg total) by mouth daily., Disp: 90 tablet, Rfl: 3   Semaglutide, 2 MG/DOSE, 8 MG/3ML SOPN, Inject 2 mg as directed once a week., Disp: 3 mL, Rfl: 5   Vitamin D, Ergocalciferol, (DRISDOL) 1.25 MG (50000 UNIT) CAPS capsule, Take 1 capsule (50,000 Units total) by mouth every 7 (seven) days., Disp: 12 capsule, Rfl: 3   Medications ordered in this encounter:  Meds ordered this encounter  Medications   valACYclovir (VALTREX) 1000 MG tablet    Sig: Take 2 tablets (2,000 mg total) by mouth 2 (two) times daily for 1 day.    Dispense:  4 tablet    Refill:  0    Supervising Provider:   Merrilee Jansky [[id number]]   trimethoprim-polymyxin b (POLYTRIM) ophthalmic solution    Sig: Place 1 drop into the left eye every 4 (four) hours.    Dispense:  10 mL    Refill:  0    Supervising Provider:   Merrilee Jansky [[id number]]     *If you need refills on other medications prior to your next appointment, please contact your pharmacy*  Follow-Up: Call back or seek an in-person evaluation if the symptoms worsen or if the condition fails to improve  as anticipated.  [facility] [phone]    If you have been instructed to have an in-person evaluation today at a local Urgent Care facility, please use the link below. It will take you to a list of all of our available [facility], including address, phone number and hours of operation. Please do not delay care.  [facility]  If you or a family member do not have a primary care provider, use the link below to schedule a visit and establish care. When you choose a [facility] [place] primary care physician or advanced practice provider, you gain a long-term partner in  health. Find a Primary Care Provider  Learn more about [facility] in-office and virtual care options:  - Get Care Now

## 2023-10-22 NOTE — Progress Notes (Signed)
 Virtual Visit Consent   Cassidy Leach, you are scheduled for a virtual visit with a [facility] provider today. Just as with appointments in the office, your consent must be obtained to participate. Your consent will be active for this visit and any virtual visit you may have with one of our providers in the next 365 days. If you have a MyChart account, a copy of this consent can be sent to you electronically.  As this is a virtual visit, video technology does not allow for your provider to perform a traditional examination. This may limit your provider's ability to fully assess your condition. If your provider identifies any concerns that need to be evaluated in person or the need to arrange testing (such as labs, EKG, etc.), we will make arrangements to do so. Although advances in technology are sophisticated, we cannot ensure that it will always work on either your end or our end. If the connection with a video visit is poor, the visit may have to be switched to a telephone visit. With either a video or telephone visit, we are not always able to ensure that we have a secure connection.  By engaging in this virtual visit, you consent to the provision of healthcare and authorize for your insurance to be billed (if applicable) for the services provided during this visit. Depending on your insurance coverage, you may receive a charge related to this service.  I need to obtain your verbal consent now. Are you willing to proceed with your visit today? NEGIN HEGG has provided verbal consent on 10/22/2023 for a virtual visit (video or telephone). Claiborne Rigg, NP  Date: 10/22/2023 8:51 AM   Virtual Visit via Video Note   I, Claiborne Rigg, connected with  [name]  ([id number], Oct 30, 1970) on 10/22/23 at  8:45 AM EDT by a video-enabled telemedicine application and verified that I am speaking with the correct person using two identifiers.  Location: Patient: Virtual Visit  Location Patient: Home Provider: Virtual Visit Location Provider: Home Office   I discussed the limitations of evaluation and management by telemedicine and the availability of in person appointments. The patient expressed understanding and agreed to proceed.    History of Present Illness: Cassidy Leach is a 53 y.o. who identifies as a female who was assigned female at birth, and is being seen today for left eye redness and cold sore on right upper lip.  Ms. Foye states she has been experiencing left eye redness and crusting/matting over the past 24 hours as well as a cold sore forming on her right upper lip. She does not endorse any other symptoms but states she gets allergy symptoms similar to today around the same time every year.     Problems:  Patient Active Problem List   Diagnosis Date Noted   Right foot pain 07/17/2023   Acute left lumbar radiculopathy 01/18/2023   Other specified menopausal and perimenopausal disorders 04/14/2022   Rectus diastasis 11/30/2021   Subacute cough 10/12/2021   Environmental and seasonal allergies 10/04/2021   Type 2 diabetes mellitus with hyperglycemia, without long-term current use of insulin (HCC) 12/22/2020   Hyperlipidemia associated with type 2 diabetes mellitus (HCC) 12/22/2020   Nodule of finger of right hand 12/10/2020   Body mass index (BMI) of 36.0-36.9 in adult 11/10/2020   Encounter for annual physical exam 11/10/2020   Neuralgia, post-herpetic 07/03/2019   OSA (obstructive sleep apnea) 10/04/2017   Chronic constipation 03/30/2017   Gastroesophageal  reflux disease without esophagitis 03/30/2017   Patellar malalignment syndrome 10/31/2015   Myalgia and myositis 09/15/2014   Nonallopathic lesion of sacral region 09/15/2014   Segmental and somatic dysfunction of cervical region 09/15/2014   Asthma, mild intermittent 03/27/2014   Vitamin D deficiency 03/27/2014   Osteoarthrosis, localized, primary, involving lower leg  11/27/2012    Allergies: No Known Allergies Medications:  Current Outpatient Medications:    trimethoprim-polymyxin b (POLYTRIM) ophthalmic solution, Place 1 drop into the left eye every 4 (four) hours., Disp: 10 mL, Rfl: 0   valACYclovir (VALTREX) 1000 MG tablet, Take 2 tablets (2,000 mg total) by mouth 2 (two) times daily for 1 day., Disp: 4 tablet, Rfl: 0   albuterol (VENTOLIN HFA) 108 (90 Base) MCG/ACT inhaler, Inhale 2 puffs into the lungs every 6 (six) hours as needed for wheezing., Disp: 18 g, Rfl: 5   azelastine (ASTELIN) 0.1 % nasal spray, Place 2 sprays into both nostrils 2 (two) times daily. Use in each nostril as directed, Disp: 30 mL, Rfl: 0   blood glucose meter kit and supplies, Monitor blood sugar every morning before eating or drinking and write on log. May monitor at additional times during the day if having symptoms., Disp: 1 each, Rfl: 99   Blood Glucose Monitoring Suppl DEVI, 1 each by Does not apply route in the morning, at noon, and at bedtime. May substitute to any manufacturer covered by patient's insurance. (One touch ultra, if available)., Disp: 1 each, Rfl: 0   fluticasone (FLONASE) 50 MCG/ACT nasal spray, Place into both nostrils. As needed, Disp: , Rfl:    Lancets (ONETOUCH DELICA PLUS LANCET33G) MISC, Use to test blood sugar every morning, Disp: 100 each, Rfl: 11   levocetirizine (XYZAL) 5 MG tablet, Take 1 tablet (5 mg total) by mouth every evening., Disp: 90 tablet, Rfl: 1   omeprazole (PRILOSEC) 40 MG capsule, Take 1 capsule by mouth daily., Disp: 90 capsule, Rfl: 1   rosuvastatin (CRESTOR) 40 MG tablet, Take 1 tablet (40 mg total) by mouth daily., Disp: 90 tablet, Rfl: 3   Semaglutide, 2 MG/DOSE, 8 MG/3ML SOPN, Inject 2 mg as directed once a week., Disp: 3 mL, Rfl: 5   Vitamin D, Ergocalciferol, (DRISDOL) 1.25 MG (50000 UNIT) CAPS capsule, Take 1 capsule (50,000 Units total) by mouth every 7 (seven) days., Disp: 12 capsule, Rfl:  3  Observations/Objective: Patient is well-developed, well-nourished in no acute distress.  Resting comfortably at home.  Head is normocephalic, atraumatic.  No labored breathing.  Speech is clear and coherent with logical content.  Patient is alert and oriented at baseline.    Assessment and Plan: 1. Bacterial conjunctivitis of left eye (Primary) - trimethoprim-polymyxin b (POLYTRIM) ophthalmic solution; Place 1 drop into the left eye every 4 (four) hours.  Dispense: 10 mL; Refill: 0  2. Herpes labialis - valACYclovir (VALTREX) 1000 MG tablet; Take 2 tablets (2,000 mg total) by mouth 2 (two) times daily for 1 day.  Dispense: 4 tablet; Refill: 0    Follow Up Instructions: I discussed the assessment and treatment plan with the patient. The patient was provided an opportunity to ask questions and all were answered. The patient agreed with the plan and demonstrated an understanding of the instructions.  A copy of instructions were sent to the patient via MyChart unless otherwise noted below.    The patient was advised to call back or seek an in-person evaluation if the symptoms worsen or if the condition fails to improve as anticipated.  Claiborne Rigg, NP

## 2023-10-27 ENCOUNTER — Encounter: Payer: Self-pay | Admitting: Nurse Practitioner

## 2023-11-16 ENCOUNTER — Ambulatory Visit: Payer: Commercial Managed Care - PPO | Admitting: Nurse Practitioner

## 2023-12-29 NOTE — Progress Notes (Signed)
 [facility] [address] [place] [address] [phone]       OFFICE FOLLOW UP NOTE  Cassidy Leach Date of Birth:  04/13/1971 Medical Record Number:  [id number]    Primary neurologist: Dr. Omar Bibber Reason for visit: Initial CPAP follow-up  Virtual Visit via Video Note  I connected with Cassidy Leach on 01/01/24 at 12:45 PM EDT by a video enabled telemedicine application and verified that I am speaking with the correct person using two identifiers.  Location: Patient: at home Provider: in office, [facility]   I discussed the limitations of evaluation and management by telemedicine and the availability of in person appointments. The patient expressed understanding and agreed to proceed.   SUBJECTIVE:   Follow-up visit:  Prior visit: 08/14/2023 with Dr. Omar Bibber  Brief HPI:   Cassidy Leach is a 53 y.o. female was seen by Dr. Omar Bibber 07/2023 for prior diagnosis of sleep apnea, PMH of diabetes, vitamin D  deficiency, reflux disease, hyperlipidemia, allergies, asthma, and obesity with status post bariatric surgery, reported snoring, excessive daytime somnolence and witnessed apneas.  Previously diagnosed with OSA on PAP therapy but no longer on therapy.  ESS 5/24.  HST 07/2023 showed moderate obstructive sleep apnea with a total AHI of 23/hour and O2 nadir of 71%.  AutoPap therapy initiated 09/2023.     Interval history:  Being seen today for initial CPAP compliance visit.  Initial compliance report as below showing optimal compliance and residual AHI.  She has not used her machine in the past month as her 18 yo grandson was staying with her and the machine scared him. He returned back home and she plans on restarting. She did see improvement of daytime energy levels, sleep quality and resolution of headaches with use.  Tolerating CPAP without difficulty.  DME [employer].  No questions or concerns at this time.     ROS:   14 system review of  systems performed and negative with exception of those listed in HPI  PMH:  Past Medical History:  Diagnosis Date   Acute non-recurrent pansinusitis 11/15/2022   Allergy    Seasonal   Asthma    Bacterial URI 09/22/2022   Blister of great toe of right foot 12/12/2022   Burn of throat 08/20/2022   Changes in vision 09/02/2021   Diabetes mellitus (HCC)    Encounter to establish care 11/10/2020   Fall 04/14/2022   Generalized abdominal pain 06/24/2022   Hyperlipidemia    Hyperlipidemia 03/27/2014   Impaired fasting glucose 05/17/2017   Myalgia and myositis    Non-recurrent acute suppurative otitis media of right ear without spontaneous rupture of tympanic membrane 01/18/2023   Osteoarthrosis involving lower leg 11/27/2012   Subacute cough 10/12/2021    PSH:  Past Surgical History:  Procedure Laterality Date   ABDOMINAL HYSTERECTOMY     FOOT SURGERY     KNEE SURGERY     PANNICULECTOMY N/A 07/09/2021   Procedure: PANNICULECTOMY;  Surgeon: Barb Bonito, MD;  Location:  SURGERY CENTER;  Service: Plastics;  Laterality: N/A;   TUBAL LIGATION      Social History:  Social History   Socioeconomic History   Marital status: Single    Spouse name: Not on file   Number of children: 3   Years of education: Not on file   Highest education level: 12th grade  Occupational History   Not on file  Tobacco Use   Smoking status: Never   Smokeless tobacco: Never  Vaping Use   Vaping status: Never Used  Substance and Sexual Activity   Alcohol use: Yes    Alcohol/week: 2.0 standard drinks of alcohol    Types: 2 Standard drinks or equivalent per week    Comment: social   Drug use: Never   Sexual activity: Not Currently    Partners: Male    Birth control/protection: Abstinence, Surgical  Other Topics Concern   Not on file  Social History Narrative   Caffeine: 1.5 cup/day   Right handed   Caffeine: lives with her daughter, son-in-law and her  grandchildren   Social  Drivers of Corporate investment banker Strain: Medium Risk (07/13/2023)   Overall Financial Resource Strain (CARDIA)    Difficulty of Paying Living Expenses: Somewhat hard  Food Insecurity: No Food Insecurity (07/13/2023)   Hunger Vital Sign    Worried About Running Out of Food in the Last Year: Never true    Ran Out of Food in the Last Year: Never true  Transportation Needs: No Transportation Needs (07/13/2023)   PRAPARE - Administrator, Civil Service (Medical): No    Lack of Transportation (Non-Medical): No  Physical Activity: Sufficiently Active (07/13/2023)   Exercise Vital Sign    Days of Exercise per Week: 5 days    Minutes of Exercise per Session: 40 min  Stress: No Stress Concern Present (07/13/2023)   [employer] of Occupational Health - Occupational Stress Questionnaire    Feeling of Stress : Only a little  Social Connections: Moderately Isolated (07/13/2023)   Social Connection and Isolation Panel [NHANES]    Frequency of Communication with Friends and Family: More than three times a week    Frequency of Social Gatherings with Friends and Family: More than three times a week    Attends Religious Services: Never    Database administrator or Organizations: Yes    Attends Engineer, structural: More than 4 times per year    Marital Status: Divorced  Intimate Partner Violence: Not At Risk (11/10/2020)   Humiliation, Afraid, Rape, and Kick questionnaire    Fear of Current or Ex-Partner: No    Emotionally Abused: No    Physically Abused: No    Sexually Abused: No    Family History:  Family History  Problem Relation Age of Onset   COPD Mother    Sleep apnea Neg Hx     Medications:   Current Outpatient Medications on File Prior to Visit  Medication Sig Dispense Refill   albuterol  (VENTOLIN  HFA) 108 (90 Base) MCG/ACT inhaler Inhale 2 puffs into the lungs every 6 (six) hours as needed for wheezing. 18 g 5   azelastine  (ASTELIN ) 0.1 % nasal  spray Place 2 sprays into both nostrils 2 (two) times daily. Use in each nostril as directed 30 mL 0   blood glucose meter kit and supplies Monitor blood sugar every morning before eating or drinking and write on log. May monitor at additional times during the day if having symptoms. 1 each 99   Blood Glucose Monitoring Suppl DEVI 1 each by Does not apply route in the morning, at noon, and at bedtime. May substitute to any manufacturer covered by patient's insurance. (One touch ultra, if available). 1 each 0   fluticasone (FLONASE) 50 MCG/ACT nasal spray Place into both nostrils. As needed     Lancets (ONETOUCH DELICA PLUS LANCET33G) MISC Use to test blood sugar every morning 100 each 11   levocetirizine (XYZAL ) 5 MG  tablet Take 1 tablet (5 mg total) by mouth every evening. 90 tablet 1   omeprazole  (PRILOSEC) 40 MG capsule Take 1 capsule by mouth daily. 90 capsule 1   rosuvastatin  (CRESTOR ) 40 MG tablet Take 1 tablet (40 mg total) by mouth daily. 90 tablet 3   Semaglutide , 2 MG/DOSE, 8 MG/3ML SOPN Inject 2 mg as directed once a week. 3 mL 5   trimethoprim -polymyxin b  (POLYTRIM ) ophthalmic solution Place 1 drop into the left eye every 4 (four) hours. 10 mL 0   Vitamin D , Ergocalciferol , (DRISDOL ) 1.25 MG (50000 UNIT) CAPS capsule Take 1 capsule (50,000 Units total) by mouth every 7 (seven) days. 12 capsule 3   No current facility-administered medications on file prior to visit.    Allergies:  No Known Allergies    OBJECTIVE:  Physical Exam  General: well developed, well nourished, seated, in no evident distress  Neurologic Exam Mental Status: Awake and fully alert. Oriented to place and time. Recent and remote memory intact. Attention span, concentration and fund of knowledge appropriate. Mood and affect appropriate.         ASSESSMENT/PLAN: Cassidy Leach is a 53 y.o. year old female    OSA on CPAP :  Initial compliance report shows satisfactory usage with optimal residual  AHI.  Has not used in the last several weeks as her young grandson was staying with her Continue current pressure settings of 7-13 with EPR 3 Discussed restarting CPAP nightly with ensuring greater than 4 hours nightly for optimal benefit and per insurance purposes.   Continue to follow with DME company for any needed supplies or CPAP related concerns     Follow up in 1 year via MyChart video visit or call earlier if needed   CC:  PCP: Early, Adriane Albe, NP    I personally spent a total of 15 minutes in the care of the patient today including preparing to see the patient, performing a medically appropriate exam/evaluation, counseling and educating, placing orders, and documenting clinical information in the EHR.  Johny Nap, AGNP-BC  [facility] [address] [place], [place] [address]  Phone [phone] Fax [phone] Note: This document was prepared with digital dictation and possible smart phrase technology. Any transcriptional errors that result from this process are unintentional.

## 2024-01-01 ENCOUNTER — Telehealth (INDEPENDENT_AMBULATORY_CARE_PROVIDER_SITE_OTHER): Admitting: Adult Health

## 2024-01-01 ENCOUNTER — Encounter: Payer: Self-pay | Admitting: Adult Health

## 2024-01-01 DIAGNOSIS — G4733 Obstructive sleep apnea (adult) (pediatric): Secondary | ICD-10-CM | POA: Diagnosis not present

## 2024-02-20 ENCOUNTER — Encounter: Payer: Self-pay | Admitting: Nurse Practitioner

## 2024-02-27 ENCOUNTER — Telehealth: Admitting: Nurse Practitioner

## 2024-03-21 ENCOUNTER — Encounter: Payer: Self-pay | Admitting: Nurse Practitioner

## 2024-03-26 ENCOUNTER — Encounter: Payer: Self-pay | Admitting: Nurse Practitioner

## 2024-03-26 ENCOUNTER — Ambulatory Visit: Admitting: Nurse Practitioner

## 2024-03-26 VITALS — BP 120/82 | HR 68 | Wt 202.6 lb

## 2024-03-26 DIAGNOSIS — E559 Vitamin D deficiency, unspecified: Secondary | ICD-10-CM

## 2024-03-26 DIAGNOSIS — K5909 Other constipation: Secondary | ICD-10-CM

## 2024-03-26 DIAGNOSIS — E1165 Type 2 diabetes mellitus with hyperglycemia: Secondary | ICD-10-CM

## 2024-03-26 DIAGNOSIS — E1169 Type 2 diabetes mellitus with other specified complication: Secondary | ICD-10-CM | POA: Diagnosis not present

## 2024-03-26 DIAGNOSIS — G4733 Obstructive sleep apnea (adult) (pediatric): Secondary | ICD-10-CM

## 2024-03-26 DIAGNOSIS — E785 Hyperlipidemia, unspecified: Secondary | ICD-10-CM

## 2024-03-26 DIAGNOSIS — Z9889 Other specified postprocedural states: Secondary | ICD-10-CM | POA: Diagnosis not present

## 2024-03-26 DIAGNOSIS — Z6836 Body mass index (BMI) 36.0-36.9, adult: Secondary | ICD-10-CM

## 2024-03-26 LAB — LIPID PANEL

## 2024-03-26 MED ORDER — ROSUVASTATIN CALCIUM 40 MG PO TABS: 40.0000 mg | ORAL_TABLET | Freq: Every day | ORAL | 3 refills | Status: AC

## 2024-03-26 MED ORDER — SEMAGLUTIDE (2 MG/DOSE) 8 MG/3ML ~~LOC~~ SOPN: 2.0000 mg | PEN_INJECTOR | SUBCUTANEOUS | 3 refills | Status: AC

## 2024-03-26 NOTE — Assessment & Plan Note (Signed)
 Post-surgical redundant skin is causing functional issues, such as difficulty keeping pants up. Previous plastic surgery did not remove enough skin, leading to recurrent skin folding as weight is lost. A referral to Dr. Lang Shadow, a plastic surgeon specializing in reconstructive surgery, has been made for further evaluation and potential correction. - Send referral to Dr. Lang Shadow for evaluation and potential reconstructive surgery.

## 2024-03-26 NOTE — Assessment & Plan Note (Signed)
 Chronic constipation persists with irregular bowel movements.No alarm symptoms present at this time. Will continue to monitor.

## 2024-03-26 NOTE — Assessment & Plan Note (Signed)
 Type 2 diabetes mellitus is better controlled. Recent itching episodes on the ear and legs, suspected to be related to hyperglycemia, have resolved. She is using a continuous glucose monitor (Dexcom G7) and is on Ozempic  2 mg, contributing to weight loss and improved satiety. - Refill Ozempic  prescription and request insurance to allow three months' supply at a time. - Continue using the Dexcom G7 continuous glucose monitor. - Order labs today.

## 2024-03-26 NOTE — Assessment & Plan Note (Signed)
 Repeat labs for evaluation.

## 2024-03-26 NOTE — Assessment & Plan Note (Signed)
 No concerns present at this time. Continue with CPAP

## 2024-03-26 NOTE — Assessment & Plan Note (Signed)
 Chronic. Currently managed with rosuvastatin . Will monitor labs.

## 2024-03-26 NOTE — Assessment & Plan Note (Signed)
 Obesity management is ongoing with significant weight loss from 220 lbs to 202 lbs. She reports feeling fuller and less hungry, attributed to Ozempic . She is motivated to continue losing weight, especially in preparation for potential reconstructive surgery. - Continue current weight loss efforts with Ozempic . - Encourage continued weight loss to facilitate potential reconstructive surgery.

## 2024-03-26 NOTE — Patient Instructions (Addendum)
 Health Maintenance Recommendations  Our records show that you are due for the following vaccines and screenings: Zoster Vaccines- Shingrix (2 of 2) This is a 2 shot series recommended for all adults over age 54 to protect against shingles.  Pneumococcal Vaccine: 50+ Years (2 of 2 - PCV) This is one time shot recommended for all adults over 50 to protect against 20 serious bacterial infections that can cause pneumonia.  INFLUENZA VACCINE  This is recommended each year to protect against the flu Hepatitis B Vaccines 19-59 Average Risk (1 of 3 - 19+ 3-dose series) This is a 3 shot series recommended to all adults to protect against hepatitis B. Hepatitis B affects the liver and can lead to serious liver complications.

## 2024-03-26 NOTE — Progress Notes (Signed)
 Catheline Doing, DNP, AGNP-c [facility]  [address] [place], [place] [address] [phone]  ESTABLISHED PATIENT- Chronic Health and/or Follow-Up Visit on 03/26/2024  Blood pressure 120/82, pulse 68, weight 202 lb 9.6 oz (91.9 kg).   HPI:  History of Present Illness Cassidy Leach is a 53 year old female with diabetes who presents for a follow-up for medication management. She is accompanied by her daughter and young granddaughter.  She experienced severe itching on her ear and legs, which began during a trip to [place] and persisted after returning home. She initially considered chlorine exposure or sand as potential causes, but the itching continued until she regained control over her blood sugar levels. The itching has since resolved and blood sugar levels have stabilized. No further concerns.   She is managing her diabetes with Ozempic , taking 2 mg doses. She has lost significant weight, from 220 lbs to 202 lbs, since her last visit. She reports feeling less hungry.   She discusses issues related to a previous plastic surgery procedure, where excess skin was not fully removed, leading to discomfort and difficulty keeping her pants up. The skin folds over as she loses weight, making it challenging to wear non-elastic pants. She has been in contact with a new plastic surgeon's office for a potential corrective procedure. She would like a referral today to discuss options for revision.   She uses a continuous glucose monitor (Dexcom G7) and reports satisfaction with its ease of use, despite a previous issue with bruising and fluid accumulation, which was resolved with a replacement device. Her insurance has been supportive in covering her medical needs, including the glucose monitor.  She experiences chronic constipation, describing it as a long-standing issue, with bowel movements occurring sporadically.  All ROS negative with exception of what is listed above.   Last diabetes eye exam in January at [facility].   PHYSICAL EXAM Physical Exam Vitals and nursing note reviewed.  Constitutional:      Appearance: Normal appearance.  HENT:     Head: Normocephalic.  Eyes:     Pupils: Pupils are equal, round, and reactive to light.  Neck:     Vascular: No carotid bruit.  Cardiovascular:     Rate and Rhythm: Normal rate and regular rhythm.     Pulses: Normal pulses.     Heart sounds: Normal heart sounds.  Pulmonary:     Effort: Pulmonary effort is normal.     Breath sounds: Normal breath sounds.  Abdominal:     General: Bowel sounds are normal. There is no distension.     Palpations: Abdomen is soft.     Tenderness: There is no abdominal tenderness. There is no guarding.  Musculoskeletal:        General: Normal range of motion.     Cervical back: Normal range of motion. No tenderness.     Right lower leg: No edema.     Left lower leg: No edema.  Skin:    General: Skin is warm.     Capillary Refill: Capillary refill takes less than 2 seconds.  Neurological:     General: No focal deficit present.     Mental Status: She is alert and oriented to person, place, and time.     Sensory: No sensory deficit.     Motor: No weakness.  Psychiatric:        Mood and Affect: Mood normal.        Behavior: Behavior normal.     PLAN Problem  List Items Addressed This Visit     Chronic constipation   Chronic constipation persists with irregular bowel movements.No alarm symptoms present at this time. Will continue to monitor.       OSA (obstructive sleep apnea)   No concerns present at this time. Continue with CPAP      Relevant Medications   Semaglutide , 2 MG/DOSE, 8 MG/3ML SOPN   Other Relevant Orders   Hemoglobin A1c   CBC with Differential/Platelet   Comprehensive metabolic panel with GFR   VITAMIN D  25 Hydroxy (Vit-D Deficiency, Fractures)   TSH   Lipid panel   Vitamin D  deficiency   Repeat labs for evaluation.       Relevant Orders    Hemoglobin A1c   CBC with Differential/Platelet   Comprehensive metabolic panel with GFR   VITAMIN D  25 Hydroxy (Vit-D Deficiency, Fractures)   TSH   Lipid panel   Body mass index (BMI) of 36.0-36.9 in adult   Obesity management is ongoing with significant weight loss from 220 lbs to 202 lbs. She reports feeling fuller and less hungry, attributed to Ozempic . She is motivated to continue losing weight, especially in preparation for potential reconstructive surgery. - Continue current weight loss efforts with Ozempic . - Encourage continued weight loss to facilitate potential reconstructive surgery.       Relevant Orders   Hemoglobin A1c   CBC with Differential/Platelet   Comprehensive metabolic panel with GFR   VITAMIN D  25 Hydroxy (Vit-D Deficiency, Fractures)   TSH   Lipid panel   Type 2 diabetes mellitus with hyperglycemia, without long-term current use of insulin  (HCC)   Type 2 diabetes mellitus is better controlled. Recent itching episodes on the ear and legs, suspected to be related to hyperglycemia, have resolved. She is using a continuous glucose monitor (Dexcom G7) and is on Ozempic  2 mg, contributing to weight loss and improved satiety. - Refill Ozempic  prescription and request insurance to allow three months' supply at a time. - Continue using the Dexcom G7 continuous glucose monitor. - Order labs today.      Relevant Medications   Semaglutide , 2 MG/DOSE, 8 MG/3ML SOPN   rosuvastatin  (CRESTOR ) 40 MG tablet   Other Relevant Orders   Hemoglobin A1c   CBC with Differential/Platelet   Comprehensive metabolic panel with GFR   VITAMIN D  25 Hydroxy (Vit-D Deficiency, Fractures)   TSH   Lipid panel   Hyperlipidemia associated with type 2 diabetes mellitus (HCC)   Chronic. Currently managed with rosuvastatin . Will monitor labs.       Relevant Medications   Semaglutide , 2 MG/DOSE, 8 MG/3ML SOPN   rosuvastatin  (CRESTOR ) 40 MG tablet   Other Relevant Orders   Hemoglobin  A1c   CBC with Differential/Platelet   Comprehensive metabolic panel with GFR   VITAMIN D  25 Hydroxy (Vit-D Deficiency, Fractures)   TSH   Lipid panel   S/P panniculectomy - Primary   Post-surgical redundant skin is causing functional issues, such as difficulty keeping pants up. Previous plastic surgery did not remove enough skin, leading to recurrent skin folding as weight is lost. A referral to Dr. Lang Shadow, a plastic surgeon specializing in reconstructive surgery, has been made for further evaluation and potential correction. - Send referral to Dr. Lang Shadow for evaluation and potential reconstructive surgery.      Relevant Orders   Ambulatory referral to Plastic Surgery    Return in about 6 months (around 09/23/2024) for CPE.  SaraBeth Kamran Coker, DNP, AGNP-c Time: 30 minutes, >  50% spent counseling, care coordination, chart review, and documentation.  SABRAtime

## 2024-03-27 LAB — CBC WITH DIFFERENTIAL/PLATELET
Basophils Absolute: 0 x10E3/uL (ref 0.0–0.2)
Basos: 1 %
EOS (ABSOLUTE): 0.1 x10E3/uL (ref 0.0–0.4)
Eos: 2 %
Hematocrit: 41.9 % (ref 34.0–46.6)
Hemoglobin: 13.2 g/dL (ref 11.1–15.9)
Immature Grans (Abs): 0 x10E3/uL (ref 0.0–0.1)
Immature Granulocytes: 0 %
Lymphocytes Absolute: 2 x10E3/uL (ref 0.7–3.1)
Lymphs: 58 %
MCH: 28.8 pg (ref 26.6–33.0)
MCHC: 31.5 g/dL (ref 31.5–35.7)
MCV: 92 fL (ref 79–97)
Monocytes Absolute: 0.2 x10E3/uL (ref 0.1–0.9)
Monocytes: 7 %
Neutrophils Absolute: 1.1 x10E3/uL — ABNORMAL LOW (ref 1.4–7.0)
Neutrophils: 32 %
Platelets: 251 x10E3/uL (ref 150–450)
RBC: 4.58 x10E6/uL (ref 3.77–5.28)
RDW: 13.7 % (ref 11.7–15.4)
WBC: 3.4 x10E3/uL (ref 3.4–10.8)

## 2024-03-27 LAB — LIPID PANEL
Cholesterol, Total: 212 mg/dL — AB (ref 100–199)
HDL: 40 mg/dL (ref >39)
LDL CALC COMMENT:: 5.3 ratio — AB (ref 0.0–4.4)
LDL Chol Calc (NIH): 154 mg/dL — AB (ref 0–99)
Triglycerides: 98 mg/dL (ref 0–149)
VLDL Cholesterol Cal: 18 mg/dL (ref 5–40)

## 2024-03-27 LAB — COMPREHENSIVE METABOLIC PANEL WITH GFR
ALT: 12 IU/L (ref 0–32)
AST: 13 IU/L (ref 0–40)
Albumin: 4.6 g/dL (ref 3.8–4.9)
Alkaline Phosphatase: 51 IU/L (ref 44–121)
BUN/Creatinine Ratio: 12 (ref 9–23)
BUN: 10 mg/dL (ref 6–24)
Bilirubin Total: 0.6 mg/dL (ref 0.0–1.2)
CO2: 22 mmol/L (ref 20–29)
Calcium: 9.8 mg/dL (ref 8.7–10.2)
Chloride: 103 mmol/L (ref 96–106)
Creatinine, Ser: 0.86 mg/dL (ref 0.57–1.00)
Globulin, Total: 2.8 g/dL (ref 1.5–4.5)
Glucose: 80 mg/dL (ref 70–99)
Potassium: 3.8 mmol/L (ref 3.5–5.2)
Sodium: 139 mmol/L (ref 134–144)
Total Protein: 7.4 g/dL (ref 6.0–8.5)
eGFR: 80 mL/min/1.73 (ref >59)

## 2024-03-27 LAB — HEMOGLOBIN A1C
Est. average glucose Bld gHb Est-mCnc: 108 mg/dL
Hgb A1c MFr Bld: 5.4 % (ref 4.8–5.6)

## 2024-03-27 LAB — VITAMIN D 25 HYDROXY (VIT D DEFICIENCY, FRACTURES): Vit D, 25-Hydroxy: 27.7 ng/mL — AB (ref 30.0–100.0)

## 2024-03-27 LAB — TSH: TSH: 1.02 u[IU]/mL (ref 0.450–4.500)

## 2024-04-03 ENCOUNTER — Ambulatory Visit: Payer: Self-pay | Admitting: Nurse Practitioner

## 2024-05-03 ENCOUNTER — Encounter: Payer: Self-pay | Admitting: Nurse Practitioner

## 2024-05-13 ENCOUNTER — Encounter: Payer: Self-pay | Admitting: Nurse Practitioner

## 2024-05-17 ENCOUNTER — Encounter: Payer: Self-pay | Admitting: Nurse Practitioner

## 2024-05-17 ENCOUNTER — Ambulatory Visit: Admitting: Nurse Practitioner

## 2024-05-17 VITALS — BP 124/82 | HR 78 | Wt 201.0 lb

## 2024-05-17 DIAGNOSIS — H04122 Dry eye syndrome of left lacrimal gland: Secondary | ICD-10-CM | POA: Diagnosis not present

## 2024-05-17 MED ORDER — SYSTANE COMPLETE PF 0.6 % OP SOLN: 2.0000 [drp] | Freq: Four times a day (QID) | OPHTHALMIC | 1 refills | Status: AC | PRN

## 2024-05-17 MED ORDER — OLOPATADINE HCL 0.2 % OP SOLN: OPHTHALMIC | 3 refills | Status: AC

## 2024-05-17 NOTE — Progress Notes (Signed)
  Camie FORBES Doing, DNP, AGNP-c [facility] [address] [place], [place] [address] [phone]   ACUTE VISIT on 05/17/2024  Blood pressure 124/82, pulse 78, weight 201 lb (91.2 kg).  Subjective:  HPI History of Present Illness Cassidy Leach is a 53 year old female who presents with left eyelid drooping and difficulty opening in the morning.  For the past one to two weeks, she has experienced difficulty opening her left eyelid in the morning, requiring manual assistance. There is no associated crustiness, drainage, itchiness, or tenderness. The left eye appears red upon waking, but the redness subsides throughout the day. Her vision in the left eye is blurry, leading her to wear glasses more frequently.  She denies any history of trauma to the eye, flakiness around the eyelid, or foreign body sensation. She has tried using Vaseline and eye wipes without improvement. Although there is no pain, the eyelid feels 'weird' and 'tired'.  She has a history of seasonal allergies but reports no current allergy symptoms affecting her eyes. She experiences occasional sinus drainage, which she attributes to changing weather conditions. She has been using over-the-counter eye drops and wipes for allergies, but these have not alleviated her symptoms.  Her grandmother had an unspecified eye condition.    ROS negative except for what is listed in HPI. History, Medications, Surgery, SDOH, and Family History reviewed and updated as appropriate.  Objective:  Physical Exam Eyes:     General:        Right eye: No foreign body, discharge or hordeolum.        Left eye: No foreign body, discharge or hordeolum.     Extraocular Movements:     Right eye: Normal extraocular motion and no nystagmus.     Left eye: Normal extraocular motion and no nystagmus.     Conjunctiva/sclera:     Right eye: Right conjunctiva is not injected. No chemosis, exudate or hemorrhage.    Left eye: Left  conjunctiva is injected. No chemosis, exudate or hemorrhage.    Comments: Left lid edematous         Assessment & Plan:   Problem List Items Addressed This Visit   None Visit Diagnoses       Dry eye of left side    -  Primary   Relevant Medications   Olopatadine HCl 0.2 % SOLN   Propylene Glycol, PF, (SYSTANE COMPLETE PF) 0.6 % SOLN      Left eye dryness and morning eyelid dysfunction Left eye dryness and difficulty opening the eyelid in the morning for the past 1-2 weeks. No crusting, drainage, or tenderness. Morning redness resolves throughout the day. Vision remains blurry. Differential includes dry eye syndrome and potential blepharitis, though no flakiness is observed. No signs of uveitis or glaucoma. Possible dysfunction of the tear gland or meibomian gland blockage considered. No signs of infection or significant inflammation. - Prescribe olopatadine drops, two drops in each eye as needed. - Recommend Systane lubricant eye drops, especially at night, to alleviate dryness. - Advise to monitor for any pain or changes in symptoms and contact eye doctor if symptoms worsen. - Follow up with eye doctor appointment next week for further evaluation.    Camie FORBES Doing, DNP, AGNP-c Time: 26 minutes, >50% spent counseling, care coordination, chart review, and documentation.

## 2024-05-24 ENCOUNTER — Encounter: Payer: Self-pay | Admitting: Nurse Practitioner

## 2024-05-27 ENCOUNTER — Encounter: Payer: Self-pay | Admitting: Radiology

## 2024-06-05 ENCOUNTER — Ambulatory Visit: Payer: Self-pay | Admitting: Internal Medicine

## 2024-06-26 ENCOUNTER — Encounter: Payer: Self-pay | Admitting: Nurse Practitioner

## 2024-07-02 ENCOUNTER — Ambulatory Visit: Payer: Self-pay

## 2024-07-02 NOTE — Telephone Encounter (Signed)
 FYI Only or Action Required?: FYI only for provider: appointment scheduled on 07/05/24.  Patient was last seen in primary care on 05/17/2024 by Early, Camie BRAVO, NP.  Called Nurse Triage reporting Eye Problem.  Symptoms began several months ago.  Interventions attempted: Prescription medications: Systane and olopatadine  drops.  Symptoms are: gradually worsening.  Triage Disposition: See PCP When Office is Open (Within 3 Days)  Patient/caregiver understands and will follow disposition?: Yes          Copied from CRM #[id number]. Topic: Clinical - Red Word Triage >> Jul 02, 2024  1:20 PM Berwyn MATSU wrote: Red Word that prompted transfer to Nurse Triage:  dry eye and red; left eye still sticky not getting better last seen on 05/17/24. Reason for Disposition  [1] Only 1 eye is red AND [2] present > 48 hours  Answer Assessment - Initial Assessment Questions Patient has been using the olopatadine  and Systane eye drops without improvement.  1. LOCATION: Location: What's red, the eyeball or the outer eyelids? (Note: when callers say the eye is red, they usually mean the sclera is red)       The eyeball.  2. REDNESS OF SCLERA: Is the redness in one or both eyes? When did the redness start?      Yes,  right eye.  3. ONSET: When did the eye become red? (e.g., hours, days)      Worse the past 2 weeks.  4. EYELIDS: Are the eyelids red or swollen? If Yes, ask: How much?      No.  5. VISION: Is there any difficulty seeing clearly?      Blurry vision every day since October.  6. ITCHING: Does it feel itchy? If so ask: How bad is it (e.g., Scale 1-10; or mild, moderate, severe)     No.  7. PAIN: Is there any pain? If Yes, ask: How bad is it? (e.g., Scale 1-10; or mild, moderate, severe)     No.  8. CONTACT LENS: Do you wear contacts?     No.  9. CAUSE: What do you think is causing the redness?     Unsure.  10. OTHER SYMPTOMS: Do you have any other  symptoms? (e.g., fever, runny nose, cough, vomiting)       No crust or discharge, itching, fever. Left eyelid feels like it is stuck in the morning.  Protocols used: Eye - Red Without Pus-A-AH

## 2024-07-05 ENCOUNTER — Ambulatory Visit: Admitting: Nurse Practitioner

## 2024-07-05 ENCOUNTER — Telehealth: Payer: Self-pay

## 2024-07-05 ENCOUNTER — Encounter: Payer: Self-pay | Admitting: Nurse Practitioner

## 2024-07-05 VITALS — BP 120/74 | HR 76 | Wt 196.6 lb

## 2024-07-05 DIAGNOSIS — H04123 Dry eye syndrome of bilateral lacrimal glands: Secondary | ICD-10-CM

## 2024-07-05 MED ORDER — CYCLOSPORINE 0.05 % OP EMUL: 1.0000 [drp] | Freq: Two times a day (BID) | OPHTHALMIC | 11 refills | Status: AC

## 2024-07-05 MED ORDER — ERYTHROMYCIN 5 MG/GM OP OINT: 1.0000 | TOPICAL_OINTMENT | Freq: Every day | OPHTHALMIC | 1 refills | Status: AC

## 2024-07-05 NOTE — Progress Notes (Signed)
 Camie FORBES Doing, DNP, AGNP-c [facility] [address] [place], [place] [address] [phone]   ACUTE VISIT : Acute or New Concern Visit on 07/05/2024  Blood pressure 120/74, pulse 76, weight 196 lb 9.6 oz (89.2 kg).   Subjective:  other (Trouble with lt. Eye lid and eye stays red all the time in rt. Eye, )  History of Present Illness Cassidy Leach is a 53 year old female who presents with persistent redness in the right eye and intermittent blurring and sticking together upon awakening in the left eye.   She experiences persistent redness in her right eye, which remains red for a significant part of the day without any apparent reason. No itching or burning is present. She does report that her eyes feel dry at times. The redness has been a concern as it draws attention from others.   Her left eye is also still intermittently red and the eyelids are sticking together upon awakening.   She has noticed vision changes, requiring her to wear glasses more frequently. Previously, she only needed them for specific tasks like reading or computer work, but now she finds it necessary to wear them all the time. She experiences difficulty seeing both near and far, sometimes needing assistance to read labels in stores.  She describes occasional sensations of having something in her right eye, although she has not observed any styes or inward eyelashes. Her left eye experiences a sensation of sticking together in the mornings, but there is no crustiness. She uses Vaseline and eye drops to manage the symptoms, applying them to both eyes. No itchiness or stringy mucus is present in the eyes.   She has no history of rosacea or dry mucous membranes. She denies photophobia or pain.   She is concerned about the potential link between her eye symptoms and her use of Ozempic , especially after reading about possible side effects related to vision. She has experienced headaches, which  she attributes to eye strain, and has been trying to reduce screen time to alleviate symptoms.  She has tried re-wetting drops, oil based eye lubricating drops, and eye ointment to help with the issue, but it has not been effective.   ROS negative except for what is listed in HPI. History, Medications, Surgery, SDOH, and Family History reviewed and updated as appropriate.  Objective:  Physical Exam Vitals and nursing note reviewed.  Constitutional:      Appearance: Normal appearance.  HENT:     Head: Normocephalic and atraumatic.     Nose: Nose normal.     Mouth/Throat:     Mouth: Mucous membranes are moist.     Pharynx: Oropharynx is clear.  Eyes:     General: Lids are normal. Lids are everted, no foreign bodies appreciated. Vision grossly intact. Gaze aligned appropriately. No visual field deficit or scleral icterus.       Right eye: No foreign body, discharge or hordeolum.        Left eye: No foreign body, discharge or hordeolum.     Extraocular Movements: Extraocular movements intact.     Conjunctiva/sclera:     Right eye: Right conjunctiva is not injected. No chemosis or hemorrhage.    Left eye: Left conjunctiva is not injected. Exudate present. No chemosis or hemorrhage.    Pupils: Pupils are equal, round, and reactive to light.  Skin:    General: Skin is warm and dry.     Capillary Refill: Capillary refill takes less than 2 seconds.  Neurological:     Mental Status: She is alert and oriented to person, place, and time.  Psychiatric:        Mood and Affect: Mood normal.         Assessment & Plan:   Assessment & Plan Chronically dry eyes, bilateral Chronic dry eye syndrome with symptoms of redness, irritation, and occasional foreign body sensation in both eyes. Corneal stain negative. No redness noted around iris or signs of infection or inflammation. Possible micro scratches due to dryness. Differential includes bacterial conjunctivitis, blepharitis but less likely  related to symptoms. Risk factors include age, female gender, diabetes, and screen use. No alarm symptoms present for HSV, keratitis, or iritis. - Prescribed Restasis (cyclosporine) emulsion drops, one drop in each eye in the morning and at bedtime. Patient has tried and failed OTC drops. - Prescribed erythromycin  ointment for bedtime use to address potential bacterial component due to eyelid sticking. - Advised use of moisturizing and rewetting drops during the day. - Recommended washing eyelids with baby shampoo or using cleansing wipes before bedtime. - Instructed to wait 30 minutes after Restasis before using other eye drops. - Advised to rest eyes during the day and reduce screen time. - Instructed to monitor symptoms and report if no improvement in six weeks. Orders:   cycloSPORINE (RESTASIS) 0.05 % ophthalmic emulsion; Place 1 drop into both eyes 2 (two) times daily.   erythromycin  ophthalmic ointment; Place 1 Application into both eyes at bedtime.   Raymund Manrique E Locklyn Henriquez, DNP, AGNP-c  41 minutes spent on care provided today. Time includes care provided during the visit (>50% total time), care coordination, chart review, and documentation.

## 2024-07-05 NOTE — Patient Instructions (Signed)
 I have sent in an eye drop called Restasis. This is a chronic daily eye drop for dry eye syndrome. It can take a full 6 weeks to achieve the full benefit. I have also sent in erythromycin  ointment to use at bedtime for the next week or so. Apply a small amount to the inside of the bottom eye lid and rub gently.  You can continue to use the other drops if you still have them), but wait at least 30 minutes after using the Restasis to make sure that the drops are well absorbed.   Dry Eye  Dry eye, also called keratoconjunctivitis sicca, is a condition caused by dryness of the membranes surrounding the eye. It happens when there are not enough healthy, natural tears in the eyes. The eyes must remain moist at all times for good comfort and vision. A small amount of tears is constantly produced by the tear glands (lacrimal glands). These glands are mainly located under the outside part of the upper eyelids. The eyelids produce oils that coat the tears to keep them from evaporating quickly. If the eyelids are inflamed (blepharitis), the lack of healthy oils can make the dry eye worse. Dry eye can happen on its own or be a symptom of several conditions, such as rheumatoid arthritis, lupus, or Sjgren's syndrome. Dry eye may be mild to severe. What are the causes? This condition may be caused by: Not making enough tears (aqueous tear-deficient dry eyes). Tears evaporating from the eyes too quickly (evaporative dry eyes). This is when there is an abnormality in the quality of your tears, especially the oils. This abnormality causes your tears to evaporate so quickly that the eyes cannot be kept moist. What increases the risk? You are more likely to develop this condition if you: Are a woman, especially if you have gone through menopause. Are older. Live in a dry climate. Live or work in a dusty or smoky area. Take certain medicines, such as: Anti-allergy medicines (antihistamines). Blood pressure medicines  (antihypertensives), especially water pills (diuretics). Birth control pills (oral contraceptives). Laxatives. Tranquilizers. Have eyelid inflammation (blepharitis). Have a history of refractive eye surgery, such as LASIK. Have a history of long-term contact lens use. What are the signs or symptoms? Symptoms of this condition include: Irritation. You may feel: Itchiness. Burning. A feeling as though something is stuck in the eye. Redness. Inflammation of the eyelids. Light sensitivity. Increased sensitivity and discomfort when wearing contact lenses. Vision that varies throughout the day. Occasional excessive tearing. How is this diagnosed? This condition is diagnosed based on your symptoms, your medical history, and an eye exam. Your health care provider may look at your eye using a microscope and may put dyes in your eye to check the health of the surface of your eye. You may have tests, such as a test to evaluate your tear production (Schirmer test). During this test: A small strip of special paper is gently pressed partly under your lower eyelid. Your tear production is measured by how much of the paper is moistened by your tears during a set amount of time. You may be referred to a health care provider who specializes in medical and surgical eye care (ophthalmologist). How is this treated? Treatment for this condition depends on the type and severity of the dry eyes. To help relieve your symptoms, your health care provider may recommend over-the-counter artificial tears. Artificial tears either come in bottles that have mild preservatives or in small vials or bottles without preservatives.  Patients with mild dry eye may do well with tears that have preservatives, while those with more severe dry eye should just use tears without preservatives. Note that one vial may be used several times a day, but should be discarded at the end of the day. If your condition is severe, treatment  may also include: Prescription eye drops. Over-the-counter or prescription gels or ointments to moisten your eyes. A prescription nasal spray that increases tear production. Minor surgery to place plugs into the tear drainage ducts. This keep tears from exiting the eye so that tears can stay on the surface of the eye longer. Medicines to reduce inflammation of the eyelids. Taking an omega-3 fatty acid nutritional supplement. Other treatments include making tears from your own blood (autologous serum tears), wearing special contact lenses, and even having minor surgery to partially close the outer parts of your eyelids to decrease evaporation. Follow these instructions at home: Take or apply over-the-counter and prescription medicines only as told by your health care provider. This includes eye drops. If directed, apply a warm compress to your eyes to help reduce eyelid inflammation. Place a towel over your eyes and gently press the warm compress over your eyes for about 5 minutes, or as long as told by your health care provider. Drink plenty of fluids to stay well hydrated. If possible, avoid dry, drafty environments. Wear sunglasses when outdoors to protect your eyes from the sun and wind. Use a humidifier at home to increase moisture in the air. Remember to blink often when reading or using the computer for long periods. If you wear contact lenses, remove them regularly to give your eyes a break. Always remove your contact lenses before sleeping. Have a yearly eye exam and vision test. Keep all follow-up visits. This is important. Contact a health care provider if: You have eye pain. You have pus-like fluid coming from your eye. Your symptoms get worse or do not improve with treatment. Get help right away if: Your vision suddenly changes. Summary Dry eye is dryness of the membranes surrounding the eye. Dry eye can happen on its own or be a symptom of several conditions, such as  rheumatoid arthritis, lupus, or Sjgren's syndrome. This condition is diagnosed based on your symptoms, your medical history, and an eye exam. Treatment for this condition depends on the type and severity of the dry eye. To help relieve your symptoms, your health care provider may recommend over-the-counter artificial tears. This information is not intended to replace advice given to you by your health care provider. Make sure you discuss any questions you have with your health care provider. Document Revised: 12/01/2020 Document Reviewed: 12/01/2020 Elsevier Patient Education  2024 [employer].

## 2024-07-05 NOTE — Telephone Encounter (Signed)
 Pharmacy Patient Advocate Encounter   Received notification from [employer] that prior authorization for cycloSPORINE (RESTASIS) 0.05 % ophthalmic emulsion  is required/requested.   Insurance verification completed.   The patient is insured through [employer].   Per test claim: PA required; PA submitted to above mentioned insurance via Latent Key/confirmation #/EOC [facility] Status is pending

## 2024-07-05 NOTE — Assessment & Plan Note (Signed)
 Chronic dry eye syndrome with symptoms of redness, irritation, and occasional foreign body sensation in both eyes. Corneal stain negative. No redness noted around iris or signs of infection or inflammation. Possible micro scratches due to dryness. Differential includes bacterial conjunctivitis, blepharitis but less likely related to symptoms. Risk factors include age, female gender, diabetes, and screen use. No alarm symptoms present for HSV, keratitis, or iritis. - Prescribed Restasis (cyclosporine) emulsion drops, one drop in each eye in the morning and at bedtime. Patient has tried and failed OTC drops. - Prescribed erythromycin  ointment for bedtime use to address potential bacterial component due to eyelid sticking. - Advised use of moisturizing and rewetting drops during the day. - Recommended washing eyelids with baby shampoo or using cleansing wipes before bedtime. - Instructed to wait 30 minutes after Restasis before using other eye drops. - Advised to rest eyes during the day and reduce screen time. - Instructed to monitor symptoms and report if no improvement in six weeks. Orders:   cycloSPORINE (RESTASIS) 0.05 % ophthalmic emulsion; Place 1 drop into both eyes 2 (two) times daily.   erythromycin  ophthalmic ointment; Place 1 Application into both eyes at bedtime.

## 2024-07-08 NOTE — Telephone Encounter (Signed)
 Pharmacy Patient Advocate Encounter  Received notification from [employer] that Prior Authorization for Restasis  0.05% emulsion has been APPROVED from 12.12.25 to 12.31.2099. Ran test claim, . This test claim was processed through [facility]- copay amounts may vary at other pharmacies due to pharmacy/plan contracts, or as the patient moves through the different stages of their insurance plan.   PA #/Case ID/Reference #: Cassidy Leach

## 2024-07-22 ENCOUNTER — Encounter: Payer: Commercial Managed Care - PPO | Admitting: Nurse Practitioner

## 2024-08-04 ENCOUNTER — Telehealth: Admitting: Physician Assistant

## 2024-08-04 DIAGNOSIS — R058 Other specified cough: Secondary | ICD-10-CM | POA: Diagnosis not present

## 2024-08-04 MED ORDER — PROMETHAZINE-DM 6.25-15 MG/5ML PO SYRP: 5.0000 mL | ORAL_SOLUTION | Freq: Four times a day (QID) | ORAL | 0 refills | Status: AC | PRN

## 2024-08-04 MED ORDER — LEVOCETIRIZINE DIHYDROCHLORIDE 5 MG PO TABS: 5.0000 mg | ORAL_TABLET | Freq: Every evening | ORAL | 0 refills | Status: AC

## 2024-08-04 NOTE — Patient Instructions (Signed)
 " Cassidy Leach, thank you for joining [name], PA-C for today's virtual visit.  While this provider is not your primary care provider (PCP), if your PCP is located in our provider database this encounter information will be shared with them immediately following your visit.   A [facility] MyChart account gives you access to today's visit and all your visits, tests, and labs performed at [facility]  click here if you don't have a [facility] MyChart account or go to [email or web address]  Consent: (Patient) Cassidy Leach provided verbal consent for this virtual visit at the beginning of the encounter.  Current Medications:  Current Outpatient Medications:    albuterol  (VENTOLIN  HFA) 108 (90 Base) MCG/ACT inhaler, Inhale 2 puffs into the lungs every 6 (six) hours as needed for wheezing., Disp: 18 g, Rfl: 5   azelastine  (ASTELIN ) 0.1 % nasal spray, Place 2 sprays into both nostrils 2 (two) times daily. Use in each nostril as directed, Disp: 30 mL, Rfl: 0   blood glucose meter kit and supplies, Monitor blood sugar every morning before eating or drinking and write on log. May monitor at additional times during the day if having symptoms., Disp: 1 each, Rfl: 99   Blood Glucose Monitoring Suppl DEVI, 1 each by Does not apply route in the morning, at noon, and at bedtime. May substitute to any manufacturer covered by patient's insurance. (One touch ultra, if available)., Disp: 1 each, Rfl: 0   Continuous Glucose Receiver (DEXCOM G7 RECEIVER) DEVI, See admin instructions. (Patient not taking: Reported on 07/05/2024), Disp: , Rfl:    Continuous Glucose Sensor (DEXCOM G7 SENSOR) MISC, Change sensor every 10 days (Patient not taking: Reported on 07/05/2024), Disp: , Rfl:    cycloSPORINE  (RESTASIS ) 0.05 % ophthalmic emulsion, Place 1 drop into both eyes 2 (two) times daily., Disp: 5.5 mL, Rfl: 11   erythromycin  ophthalmic ointment, Place 1 Application into both eyes at  bedtime., Disp: 3.5 g, Rfl: 1   fluticasone (FLONASE) 50 MCG/ACT nasal spray, Place into both nostrils. As needed, Disp: , Rfl:    Lancets (ONETOUCH DELICA PLUS LANCET33G) MISC, Use to test blood sugar every morning, Disp: 100 each, Rfl: 11   levocetirizine (XYZAL ) 5 MG tablet, Take 1 tablet (5 mg total) by mouth every evening., Disp: 90 tablet, Rfl: 1   Olopatadine  HCl 0.2 % SOLN, 2 drops in each eye every 8 hours as needed., Disp: 3.5 mL, Rfl: 3   omeprazole  (PRILOSEC) 40 MG capsule, Take 1 capsule by mouth daily., Disp: 90 capsule, Rfl: 1   Propylene Glycol, PF, (SYSTANE COMPLETE PF) 0.6 % SOLN, Apply 2 drops to eye every 6 (six) hours as needed., Disp: 10 mL, Rfl: 1   rosuvastatin  (CRESTOR ) 40 MG tablet, Take 1 tablet (40 mg total) by mouth daily., Disp: 90 tablet, Rfl: 3   Semaglutide , 2 MG/DOSE, 8 MG/3ML SOPN, Inject 2 mg as directed once a week., Disp: 9 mL, Rfl: 3   Vitamin D , Ergocalciferol , (DRISDOL ) 1.25 MG (50000 UNIT) CAPS capsule, Take 1 capsule (50,000 Units total) by mouth every 7 (seven) days., Disp: 12 capsule, Rfl: 3   Medications ordered in this encounter:  No orders of the defined types were placed in this encounter.    *If you need refills on other medications prior to your next appointment, please contact your pharmacy*  Follow-Up: Call back or seek an in-person evaluation if the symptoms worsen or if the condition fails to improve as anticipated.  [facility] Virtual  Care [phone]  Other Instructions Follow up with primary provider in 24-48 hours. Report to nearest ER with any worsening symptoms.    If you have been instructed to have an in-person evaluation today at a local Urgent Care facility, please use the link below. It will take you to a list of all of our available [facility], including address, phone number and hours of operation. Please do not delay care.  [facility]  If you or a family member do not have a primary care  provider, use the link below to schedule a visit and establish care. When you choose a [facility] primary care physician or advanced practice provider, you gain a long-term partner in health. Find a Primary Care Provider  Learn more about [facility]'s in-office and virtual care options: [facility] - Get Care Now  "

## 2024-08-04 NOTE — Progress Notes (Signed)
 " Virtual Visit Consent   [name], you are scheduled for a virtual visit with a [facility] provider today. Just as with appointments in the office, your consent must be obtained to participate. Your consent will be active for this visit and any virtual visit you may have with one of our providers in the next 365 days. If you have a MyChart account, a copy of this consent can be sent to you electronically.  As this is a virtual visit, video technology does not allow for your provider to perform a traditional examination. This may limit your provider's ability to fully assess your condition. If your provider identifies any concerns that need to be evaluated in person or the need to arrange testing (such as labs, EKG, etc.), we will make arrangements to do so. Although advances in technology are sophisticated, we cannot ensure that it will always work on either your end or our end. If the connection with a video visit is poor, the visit may have to be switched to a telephone visit. With either a video or telephone visit, we are not always able to ensure that we have a secure connection.  By engaging in this virtual visit, you consent to the provision of healthcare and authorize for your insurance to be billed (if applicable) for the services provided during this visit. Depending on your insurance coverage, you may receive a charge related to this service.  I need to obtain your verbal consent now. Are you willing to proceed with your visit today? Cassidy Leach has provided verbal consent on 08/04/2024 for a virtual visit (video or telephone). Cassidy Leach, [place]  Date: 08/04/2024 2:21 PM   Virtual Visit via Video Note   I, Cassidy Leach, connected with  KINDSEY EBLIN  ([id number], May 10, 1971) on 08/04/2024 at  2:15 PM EST by a video-enabled telemedicine application and verified that I am speaking with the correct person using two identifiers.  Location: Patient: Virtual Visit Location  Patient: Home Provider: Virtual Visit Location Provider: Home Office   I discussed the limitations of evaluation and management by telemedicine and the availability of in person appointments. The patient expressed understanding and agreed to proceed.    History of Present Illness: Cassidy Leach is a 54 y.o. who identifies as a female who was assigned female at birth, and is being seen today for URI.  HPI: Cough This is a new problem. The current episode started 1 to 4 weeks ago. The problem has been unchanged. The cough is Non-productive. Associated symptoms include rhinorrhea. Nothing aggravates the symptoms. She has tried OTC cough suppressant and prescription cough suppressant for the symptoms. The treatment provided mild relief.    Problems:  Patient Active Problem List   Diagnosis Date Noted   Chronically dry eyes, bilateral 07/05/2024   S/P panniculectomy 03/26/2024   Other specified menopausal and perimenopausal disorders 04/14/2022   Rectus diastasis 11/30/2021   Environmental and seasonal allergies 10/04/2021   Type 2 diabetes mellitus with hyperglycemia, without long-term current use of insulin  (HCC) 12/22/2020   Hyperlipidemia associated with type 2 diabetes mellitus (HCC) 12/22/2020   Nodule of finger of right hand 12/10/2020   Body mass index (BMI) of 36.0-36.9 in adult 11/10/2020   Encounter for annual physical exam 11/10/2020   Neuralgia, post-herpetic 07/03/2019   OSA (obstructive sleep apnea) 10/04/2017   Chronic constipation 03/30/2017   Gastroesophageal reflux disease without esophagitis 03/30/2017   Patellar malalignment syndrome 10/31/2015   Myalgia and myositis 09/15/2014  Nonallopathic lesion of sacral region 09/15/2014   Segmental and somatic dysfunction of cervical region 09/15/2014   Asthma, mild intermittent 03/27/2014   Vitamin D  deficiency 03/27/2014   Osteoarthrosis, localized, primary, involving lower leg 11/27/2012    Allergies:  Allergies[1] Medications: Current Medications[2]  Observations/Objective: Patient is well-developed, well-nourished in no acute distress.  Resting comfortably on passenger side of vehicle. Head is normocephalic, atraumatic.  No labored breathing.  Speech is clear and coherent with logical content.  Patient is alert and oriented at baseline.    Assessment and Plan: 1. Post-viral cough syndrome (Primary)  Patient presenting with post viral cough. Differentials include URI allergic rhinitis, COVID,  bacterial pneumonia, sinusitis. Do not suspect underlying cardiopulmonary process. I considered, but think unlikely, dangerous causes of this patient's symptoms to include ACS, CHF or pneumonia, pneumothorax. Patient is nontoxic and not in need of emergent medical intervention. Plan: reassurance, reassessment, discharge with PCP follow-up  Follow Up Instructions: I discussed the assessment and treatment plan with the patient. The patient was provided an opportunity to ask questions and all were answered. The patient agreed with the plan and demonstrated an understanding of the instructions.  A copy of instructions were sent to the patient via MyChart unless otherwise noted below.     The patient was advised to call back or seek an in-person evaluation if the symptoms worsen or if the condition fails to improve as anticipated.    Arlet Marter, PA-C    [1] No Known Allergies [2]  Current Outpatient Medications:    albuterol  (VENTOLIN  HFA) 108 (90 Base) MCG/ACT inhaler, Inhale 2 puffs into the lungs every 6 (six) hours as needed for wheezing., Disp: 18 g, Rfl: 5   azelastine  (ASTELIN ) 0.1 % nasal spray, Place 2 sprays into both nostrils 2 (two) times daily. Use in each nostril as directed, Disp: 30 mL, Rfl: 0   blood glucose meter kit and supplies, Monitor blood sugar every morning before eating or drinking and write on log. May monitor at additional times during the day if having symptoms.,  Disp: 1 each, Rfl: 99   Blood Glucose Monitoring Suppl DEVI, 1 each by Does not apply route in the morning, at noon, and at bedtime. May substitute to any manufacturer covered by patient's insurance. (One touch ultra, if available)., Disp: 1 each, Rfl: 0   Continuous Glucose Receiver (DEXCOM G7 RECEIVER) DEVI, See admin instructions. (Patient not taking: Reported on 07/05/2024), Disp: , Rfl:    Continuous Glucose Sensor (DEXCOM G7 SENSOR) MISC, Change sensor every 10 days (Patient not taking: Reported on 07/05/2024), Disp: , Rfl:    cycloSPORINE  (RESTASIS ) 0.05 % ophthalmic emulsion, Place 1 drop into both eyes 2 (two) times daily., Disp: 5.5 mL, Rfl: 11   erythromycin  ophthalmic ointment, Place 1 Application into both eyes at bedtime., Disp: 3.5 g, Rfl: 1   fluticasone (FLONASE) 50 MCG/ACT nasal spray, Place into both nostrils. As needed, Disp: , Rfl:    Lancets (ONETOUCH DELICA PLUS LANCET33G) MISC, Use to test blood sugar every morning, Disp: 100 each, Rfl: 11   levocetirizine (XYZAL ) 5 MG tablet, Take 1 tablet (5 mg total) by mouth every evening., Disp: 90 tablet, Rfl: 1   Olopatadine  HCl 0.2 % SOLN, 2 drops in each eye every 8 hours as needed., Disp: 3.5 mL, Rfl: 3   omeprazole  (PRILOSEC) 40 MG capsule, Take 1 capsule by mouth daily., Disp: 90 capsule, Rfl: 1   Propylene Glycol, PF, (SYSTANE COMPLETE PF) 0.6 % SOLN, Apply 2  drops to eye every 6 (six) hours as needed., Disp: 10 mL, Rfl: 1   rosuvastatin  (CRESTOR ) 40 MG tablet, Take 1 tablet (40 mg total) by mouth daily., Disp: 90 tablet, Rfl: 3   Semaglutide , 2 MG/DOSE, 8 MG/3ML SOPN, Inject 2 mg as directed once a week., Disp: 9 mL, Rfl: 3   Vitamin D , Ergocalciferol , (DRISDOL ) 1.25 MG (50000 UNIT) CAPS capsule, Take 1 capsule (50,000 Units total) by mouth every 7 (seven) days., Disp: 12 capsule, Rfl: 3  "

## 2024-08-12 ENCOUNTER — Encounter: Payer: Self-pay | Admitting: Nurse Practitioner

## 2024-08-14 ENCOUNTER — Telehealth: Payer: Self-pay | Admitting: Pharmacy Technician

## 2024-08-14 ENCOUNTER — Other Ambulatory Visit (HOSPITAL_COMMUNITY): Payer: Self-pay

## 2024-08-14 NOTE — Telephone Encounter (Signed)
 Pharmacy Patient Advocate Encounter   Received notification from [facility] that prior authorization for Ozempic  (2 MG/DOSE) 8MG /3ML pen-injectors is required/requested.   Insurance verification completed.   The patient is insured through [employer].   Per test claim: PA required; PA submitted to above mentioned insurance via Latent Key/confirmation #/EOC [id number] Status is pending

## 2024-08-14 NOTE — Telephone Encounter (Signed)
 Pharmacy Patient Advocate Encounter  Received notification from [employer] that Prior Authorization for Ozempic  (2 MG/DOSE) 8MG /3ML pen-injectors has been APPROVED from 08/14/2024 to 08/14/2025. Ran test claim, Copay is $49.99/28 day supply. This test claim was processed through [facility]- copay amounts may vary at other pharmacies due to pharmacy/plan contracts, or as the patient moves through the different stages of their insurance plan.   PA #/Case ID/Reference #: [id number]

## 2024-08-16 ENCOUNTER — Other Ambulatory Visit (HOSPITAL_COMMUNITY): Payer: Self-pay

## 2024-08-25 ENCOUNTER — Encounter (HOSPITAL_BASED_OUTPATIENT_CLINIC_OR_DEPARTMENT_OTHER): Payer: Self-pay | Admitting: Emergency Medicine

## 2024-08-25 ENCOUNTER — Other Ambulatory Visit: Payer: Self-pay

## 2024-08-25 ENCOUNTER — Emergency Department (HOSPITAL_BASED_OUTPATIENT_CLINIC_OR_DEPARTMENT_OTHER)
Admission: EM | Admit: 2024-08-25 | Discharge: 2024-08-25 | Disposition: A | Discharge status: Home / Self Care | Attending: Emergency Medicine | Admitting: Emergency Medicine

## 2024-08-25 ENCOUNTER — Encounter: Payer: Self-pay | Admitting: Nurse Practitioner

## 2024-08-25 DIAGNOSIS — M25512 Pain in left shoulder: Secondary | ICD-10-CM | POA: Insufficient documentation

## 2024-08-25 DIAGNOSIS — E119 Type 2 diabetes mellitus without complications: Secondary | ICD-10-CM | POA: Insufficient documentation

## 2024-08-25 DIAGNOSIS — J45909 Unspecified asthma, uncomplicated: Secondary | ICD-10-CM | POA: Insufficient documentation

## 2024-08-25 DIAGNOSIS — M542 Cervicalgia: Secondary | ICD-10-CM | POA: Insufficient documentation

## 2024-08-25 DIAGNOSIS — W19XXXA Unspecified fall, initial encounter: Secondary | ICD-10-CM

## 2024-08-25 DIAGNOSIS — R519 Headache, unspecified: Secondary | ICD-10-CM | POA: Insufficient documentation

## 2024-08-25 DIAGNOSIS — W000XXA Fall on same level due to ice and snow, initial encounter: Secondary | ICD-10-CM | POA: Insufficient documentation

## 2024-08-25 DIAGNOSIS — M25511 Pain in right shoulder: Secondary | ICD-10-CM | POA: Insufficient documentation

## 2024-08-25 DIAGNOSIS — M545 Low back pain, unspecified: Secondary | ICD-10-CM | POA: Insufficient documentation

## 2024-08-25 MED ORDER — CYCLOBENZAPRINE HCL 10 MG PO TABS: 10.0000 mg | ORAL_TABLET | Freq: Two times a day (BID) | ORAL | 0 refills | Status: AC | PRN

## 2024-08-25 NOTE — ED Triage Notes (Signed)
 Pt c/o mechanical fall today on ice. C/O HA, back pain, and anterior rib pain. LOC for a quick second. GCS 15, speech clear

## 2024-08-25 NOTE — Discharge Instructions (Addendum)
 It was a pleasure meeting with you today. As we discussed your physical exam is reassuring in regards to exhibiting no neurological or spinal concerns. You did have some tense muscles in your neck and back. For this I have sent prescription muscle relaxer to your pharmacy. Take this medication as needed for muscle pain. Do not drink alcohol or operate heavy machinery while taking this medication. Be careful when taking this medication as it can make you more susceptible to additional falls.   Please use Tylenol  or ibuprofen for pain.  You may use 600 mg ibuprofen every 6 hours or 1000 mg of Tylenol  every 6 hours.  You may choose to alternate between the 2.  This would be most effective.  Not to exceed 4 g of Tylenol  within 24 hours.  Not to exceed 3200 mg ibuprofen 24 hours.  Follow up with your PCP as needed. I have also included a local orthopedic office information; if your pain persists greater than 1 to 2 weeks then I would recommend making an appointment with them for additional evaluation and potential for physical therapy.

## 2024-08-27 ENCOUNTER — Ambulatory Visit: Admitting: Family Medicine

## 2024-08-27 VITALS — BP 124/86 | HR 55 | Wt 194.6 lb

## 2024-08-27 DIAGNOSIS — S161XXA Strain of muscle, fascia and tendon at neck level, initial encounter: Secondary | ICD-10-CM

## 2024-08-27 DIAGNOSIS — M549 Dorsalgia, unspecified: Secondary | ICD-10-CM

## 2024-08-27 NOTE — Progress Notes (Signed)
" ° °  Subjective:    Patient ID: Cassidy Leach, female    DOB: 08-22-1970, 54 y.o.   MRN: [id number]  Discussed the use of AI scribe software for clinical note transcription with the patient, who gave verbal consent to proceed.  History of Present Illness   Cassidy Leach is a 54 year old female who presents with pain following a fall on ice.  She fell on Sunday while exiting a store, landing on her back and hitting the back of her head on a mixture of concrete and ice. Persistent pain has been present since the incident.  The pain is located in her shoulders and neck, with the left side being more painful than the right. It is exacerbated by turning her head and has not improved since the fall. She visited the emergency room at [facility], where she was prescribed muscle relaxers, but no imaging studies were performed.  Also recommended Tylenol .  She has been taking Flexeril , two or three doses since the incident, and Tylenol , two 500 mg tablets every five to six hours. Due to a previous gastric bypass surgery, she cannot take NSAIDs like Advil or Aleve to prevent stomach irritation.  Her pain has not significantly improved and sometimes feels worse, with additional soreness in her stomach muscles, though she is unsure if this is related to the fall.  No numbness or tingling down her arms.           Review of Systems     Objective:    Physical Exam Physical Exam   NECK: Sore on palpation in the upper back area but no point tenderness. MUSCULOSKELETAL: Shoulders non-tender on palpation. Spine sore on palpation.       The [facility] emergency room record was reviewed.     Assessment & Plan:  Assessment and Plan    Muscle strain of neck and shoulders after fall Muscle strain with pain in both shoulders, left more than right. No red flags or neurological deficits. Imaging not indicated. Symptoms expected to improve in days. Avoid NSAIDs due to gastric bypass. - Continue  Flexeril  as needed, especially at night. - Continue Tylenol  500 mg, two tablets three times daily. - Apply heat for 20 minutes, three to four times daily.  - Reassess in one to two weeks if symptoms persist or worsen.        "

## 2024-09-26 ENCOUNTER — Encounter: Payer: Self-pay | Admitting: Nurse Practitioner
